# Patient Record
Sex: Male | Born: 1960 | Race: White | Hispanic: No | Marital: Married | Smoking: Former smoker
Health system: Southern US, Community
[De-identification: ages and names within clinical notes are randomized; demographics above are authoritative.]

## PROBLEM LIST (undated history)

## (undated) DIAGNOSIS — R918 Other nonspecific abnormal finding of lung field: Secondary | ICD-10-CM

## (undated) DIAGNOSIS — E291 Testicular hypofunction: Secondary | ICD-10-CM

## (undated) DIAGNOSIS — Z7901 Long term (current) use of anticoagulants: Secondary | ICD-10-CM

## (undated) DIAGNOSIS — R251 Tremor, unspecified: Secondary | ICD-10-CM

## (undated) DIAGNOSIS — E538 Deficiency of other specified B group vitamins: Secondary | ICD-10-CM

## (undated) DIAGNOSIS — E559 Vitamin D deficiency, unspecified: Secondary | ICD-10-CM

## (undated) DIAGNOSIS — Z87442 Personal history of urinary calculi: Secondary | ICD-10-CM

## (undated) DIAGNOSIS — I480 Paroxysmal atrial fibrillation: Secondary | ICD-10-CM

## (undated) DIAGNOSIS — E119 Type 2 diabetes mellitus without complications: Secondary | ICD-10-CM

## (undated) DIAGNOSIS — S069XAA Unspecified intracranial injury with loss of consciousness status unknown, initial encounter: Secondary | ICD-10-CM

## (undated) DIAGNOSIS — E7800 Pure hypercholesterolemia, unspecified: Secondary | ICD-10-CM

## (undated) DIAGNOSIS — R7303 Prediabetes: Secondary | ICD-10-CM

## (undated) DIAGNOSIS — E042 Nontoxic multinodular goiter: Secondary | ICD-10-CM

## (undated) DIAGNOSIS — G5622 Lesion of ulnar nerve, left upper limb: Secondary | ICD-10-CM

## (undated) DIAGNOSIS — I272 Pulmonary hypertension, unspecified: Secondary | ICD-10-CM

## (undated) DIAGNOSIS — M5135 Other intervertebral disc degeneration, thoracolumbar region: Secondary | ICD-10-CM

## (undated) DIAGNOSIS — I420 Dilated cardiomyopathy: Secondary | ICD-10-CM

## (undated) DIAGNOSIS — M8589 Other specified disorders of bone density and structure, multiple sites: Secondary | ICD-10-CM

## (undated) DIAGNOSIS — M170 Bilateral primary osteoarthritis of knee: Secondary | ICD-10-CM

## (undated) DIAGNOSIS — I5020 Unspecified systolic (congestive) heart failure: Secondary | ICD-10-CM

## (undated) DIAGNOSIS — I10 Essential (primary) hypertension: Secondary | ICD-10-CM

## (undated) DIAGNOSIS — F32A Depression, unspecified: Secondary | ICD-10-CM

## (undated) DIAGNOSIS — G43909 Migraine, unspecified, not intractable, without status migrainosus: Secondary | ICD-10-CM

## (undated) DIAGNOSIS — I4891 Unspecified atrial fibrillation: Secondary | ICD-10-CM

## (undated) DIAGNOSIS — I429 Cardiomyopathy, unspecified: Secondary | ICD-10-CM

## (undated) DIAGNOSIS — M1990 Unspecified osteoarthritis, unspecified site: Secondary | ICD-10-CM

## (undated) DIAGNOSIS — I4729 Other ventricular tachycardia: Secondary | ICD-10-CM

## (undated) DIAGNOSIS — G4733 Obstructive sleep apnea (adult) (pediatric): Secondary | ICD-10-CM

## (undated) DIAGNOSIS — N183 Chronic kidney disease, stage 3 unspecified: Secondary | ICD-10-CM

## (undated) DIAGNOSIS — R972 Elevated prostate specific antigen [PSA]: Secondary | ICD-10-CM

## (undated) DIAGNOSIS — I499 Cardiac arrhythmia, unspecified: Secondary | ICD-10-CM

## (undated) DIAGNOSIS — E1140 Type 2 diabetes mellitus with diabetic neuropathy, unspecified: Secondary | ICD-10-CM

## (undated) HISTORY — PX: CARDIAC CATHETERIZATION: SHX172

## (undated) HISTORY — PX: JOINT REPLACEMENT: SHX530

## (undated) HISTORY — PX: NASAL SINUS SURGERY: SHX719

## (undated) HISTORY — PX: EP IMPLANTABLE DEVICE: SHX172B

## (undated) HISTORY — PX: KNEE ARTHROSCOPY: SUR90

---

## 2001-11-17 HISTORY — PX: NASAL SINUS SURGERY: SHX719

## 2004-02-29 HISTORY — PX: KNEE ARTHROSCOPY W/ MENISCAL REPAIR: SHX1877

## 2005-08-13 ENCOUNTER — Ambulatory Visit: Payer: Self-pay | Admitting: Otolaryngology

## 2009-01-28 ENCOUNTER — Emergency Department: Payer: Self-pay

## 2011-02-14 ENCOUNTER — Ambulatory Visit: Payer: Self-pay | Admitting: Unknown Physician Specialty

## 2011-03-21 ENCOUNTER — Ambulatory Visit: Payer: Self-pay | Admitting: Unknown Physician Specialty

## 2011-04-01 HISTORY — PX: KNEE ARTHROSCOPY W/ MENISCAL REPAIR: SHX1877

## 2012-04-28 ENCOUNTER — Ambulatory Visit: Payer: Self-pay | Admitting: Cardiology

## 2012-04-28 HISTORY — PX: LEFT HEART CATH AND CORONARY ANGIOGRAPHY: CATH118249

## 2012-07-05 ENCOUNTER — Ambulatory Visit: Payer: Self-pay | Admitting: Cardiology

## 2013-01-11 ENCOUNTER — Encounter: Payer: Self-pay | Admitting: Cardiology

## 2013-01-15 ENCOUNTER — Encounter: Payer: Self-pay | Admitting: Cardiology

## 2013-02-15 ENCOUNTER — Encounter: Payer: Self-pay | Admitting: Cardiology

## 2013-03-17 ENCOUNTER — Encounter: Payer: Self-pay | Admitting: Cardiology

## 2013-03-21 DIAGNOSIS — Z95810 Presence of automatic (implantable) cardiac defibrillator: Secondary | ICD-10-CM

## 2013-03-21 HISTORY — DX: Presence of automatic (implantable) cardiac defibrillator: Z95.810

## 2013-03-31 HISTORY — PX: ICD IMPLANT: EP1208

## 2013-04-17 ENCOUNTER — Encounter: Payer: Self-pay | Admitting: Cardiology

## 2015-02-12 ENCOUNTER — Ambulatory Visit
Admit: 2015-02-12 | Disposition: A | Discharge status: Home / Self Care | Payer: Self-pay | Admitting: Unknown Physician Specialty

## 2015-07-30 ENCOUNTER — Encounter: Payer: Self-pay | Admitting: Emergency Medicine

## 2015-07-30 ENCOUNTER — Emergency Department: Payer: BLUE CROSS/BLUE SHIELD

## 2015-07-30 ENCOUNTER — Emergency Department
Admission: EM | Admit: 2015-07-30 | Discharge: 2015-07-30 | Disposition: A | Discharge status: Home / Self Care | Payer: BLUE CROSS/BLUE SHIELD | Attending: Emergency Medicine | Admitting: Emergency Medicine

## 2015-07-30 DIAGNOSIS — Z7901 Long term (current) use of anticoagulants: Secondary | ICD-10-CM | POA: Diagnosis not present

## 2015-07-30 DIAGNOSIS — R1032 Left lower quadrant pain: Secondary | ICD-10-CM | POA: Insufficient documentation

## 2015-07-30 DIAGNOSIS — I499 Cardiac arrhythmia, unspecified: Secondary | ICD-10-CM | POA: Diagnosis not present

## 2015-07-30 DIAGNOSIS — Z79899 Other long term (current) drug therapy: Secondary | ICD-10-CM | POA: Diagnosis not present

## 2015-07-30 DIAGNOSIS — Z87891 Personal history of nicotine dependence: Secondary | ICD-10-CM | POA: Insufficient documentation

## 2015-07-30 HISTORY — DX: Unspecified atrial fibrillation: I48.91

## 2015-07-30 LAB — URINALYSIS COMPLETE WITH MICROSCOPIC (ARMC ONLY)
BACTERIA UA: NONE SEEN
Bilirubin Urine: NEGATIVE
GLUCOSE, UA: NEGATIVE mg/dL
HGB URINE DIPSTICK: NEGATIVE
Ketones, ur: NEGATIVE mg/dL
LEUKOCYTES UA: NEGATIVE
NITRITE: NEGATIVE
PH: 7 (ref 5.0–8.0)
Protein, ur: NEGATIVE mg/dL
SPECIFIC GRAVITY, URINE: 1.01 (ref 1.005–1.030)
SQUAMOUS EPITHELIAL / LPF: NONE SEEN

## 2015-07-30 LAB — COMPREHENSIVE METABOLIC PANEL
ALBUMIN: 3.8 g/dL (ref 3.5–5.0)
ALK PHOS: 59 U/L (ref 38–126)
ALT: 17 U/L (ref 17–63)
AST: 25 U/L (ref 15–41)
Anion gap: 9 (ref 5–15)
BILIRUBIN TOTAL: 0.7 mg/dL (ref 0.3–1.2)
BUN: 18 mg/dL (ref 6–20)
CALCIUM: 8.5 mg/dL — AB (ref 8.9–10.3)
CO2: 26 mmol/L (ref 22–32)
Chloride: 105 mmol/L (ref 101–111)
Creatinine, Ser: 1.09 mg/dL (ref 0.61–1.24)
GFR calc non Af Amer: >60 mL/min (ref >60)
GLUCOSE: 131 mg/dL — AB (ref 65–99)
Potassium: 3.5 mmol/L (ref 3.5–5.1)
Sodium: 140 mmol/L (ref 135–145)
TOTAL PROTEIN: 7 g/dL (ref 6.5–8.1)

## 2015-07-30 LAB — CBC WITH DIFFERENTIAL/PLATELET
BASOS ABS: 0 10*3/uL (ref 0–0.1)
BASOS PCT: 1 %
Eosinophils Absolute: 0.1 10*3/uL (ref 0–0.7)
Eosinophils Relative: 2 %
HEMATOCRIT: 47.8 % (ref 40.0–52.0)
HEMOGLOBIN: 15.8 g/dL (ref 13.0–18.0)
LYMPHS PCT: 21 %
Lymphs Abs: 1.3 10*3/uL (ref 1.0–3.6)
MCH: 29.2 pg (ref 26.0–34.0)
MCHC: 33 g/dL (ref 32.0–36.0)
MCV: 88.3 fL (ref 80.0–100.0)
MONO ABS: 0.5 10*3/uL (ref 0.2–1.0)
Monocytes Relative: 8 %
NEUTROS ABS: 4.5 10*3/uL (ref 1.4–6.5)
NEUTROS PCT: 68 %
Platelets: 194 10*3/uL (ref 150–440)
RBC: 5.42 MIL/uL (ref 4.40–5.90)
RDW: 13.7 % (ref 11.5–14.5)
WBC: 6.5 10*3/uL (ref 3.8–10.6)

## 2015-07-30 LAB — LIPASE, BLOOD: Lipase: 22 U/L (ref 22–51)

## 2015-07-30 MED ORDER — KETOROLAC TROMETHAMINE 30 MG/ML IJ SOLN
30.0000 mg | Freq: Once | INTRAMUSCULAR | Status: AC
  Administered 2015-07-30: 30 mg via INTRAVENOUS
  Filled 2015-07-30: qty 1

## 2015-07-30 MED ORDER — NAPROXEN 500 MG PO TABS: 500.0000 mg | ORAL_TABLET | Freq: Two times a day (BID) | ORAL | Status: DC

## 2015-07-30 MED ORDER — GI COCKTAIL ~~LOC~~
30.0000 mL | ORAL | Status: AC
  Administered 2015-07-30: 30 mL via ORAL
  Filled 2015-07-30: qty 30

## 2015-07-30 MED ORDER — FAMOTIDINE 20 MG PO TABS
40.0000 mg | ORAL_TABLET | Freq: Once | ORAL | Status: AC
  Administered 2015-07-30: 40 mg via ORAL
  Filled 2015-07-30: qty 2

## 2015-07-30 MED ORDER — ONDANSETRON 8 MG PO TBDP: 8.0000 mg | ORAL_TABLET | Freq: Three times a day (TID) | ORAL | Status: DC | PRN

## 2015-07-30 NOTE — Discharge Instructions (Signed)

## 2015-07-30 NOTE — ED Provider Notes (Signed)
[facility] Emergency Department Provider Note  ____________________________________________  Time seen: 7:50 AM on arrival by EMS  I have reviewed the triage vital signs and the nursing notes.   HISTORY  Chief Complaint Abdominal Pain    HPI Tyler Montgomery is a 54 y.o. male complaining of left flank pain that started this morning and has now migrated to the left lower quadrant. Denies any nausea vomiting or diarrhea. No trauma. It's worse when he is upright and walking and better when he is lying down. Worse with movement. No chest pain shortness of breath fevers chills. No dysuria frequency urgency hematuria. Denies history of kidney stones.  Does have a history of chronic paroxysmal atrial fibrillation for which she follows up with Dr. Lady Gary. He also has an EF of 20% and has an ICD.   Past Medical History  Diagnosis Date  . A-fib      There are no active problems to display for this patient.    Past Surgical History  Procedure Laterality Date  . Joint replacement    . Ep implantable device    . Nasal sinus surgery       Current Outpatient Rx  Name  Route  Sig  Dispense  Refill  . carvedilol (COREG) 3.125 MG tablet   Oral   Take 1 tablet by mouth 2 (two) times daily.      0   . dofetilide (TIKOSYN) 500 MCG capsule   Oral   Take 1 capsule by mouth 2 (two) times daily.      2   . furosemide (LASIX) 40 MG tablet   Oral   Take 1-2 tablets by mouth 3 (three) times daily. Take 2 tablets in the morning and 1 tablet in the evening.      0   . lisinopril (PRINIVIL,ZESTRIL) 5 MG tablet   Oral   Take 1 tablet by mouth daily.         . metoprolol succinate (TOPROL-XL) 100 MG 24 hr tablet   Oral   Take 1 tablet by mouth daily.      3   . omeprazole (PRILOSEC) 40 MG capsule   Oral   Take 1 capsule by mouth daily.      4   . potassium chloride SA (K-DUR,KLOR-CON) 20 MEQ tablet   Oral   Take 1 tablet by mouth daily.      3   . testosterone (ANDROGEL) 50 MG/5GM (1%) GEL   Transdermal   Place 5 g onto the skin daily.       5   . XARELTO 20 MG TABS tablet   Oral   Take 1 tablet by mouth every evening.      6     Dispense as written.   . naproxen (NAPROSYN) 500 MG tablet   Oral   Take 1 tablet (500 mg total) by mouth 2 (two) times daily with a meal.   20 tablet   0   . ondansetron (ZOFRAN ODT) 8 MG disintegrating tablet   Oral   Take 1 tablet (8 mg total) by mouth every 8 (eight) hours as needed for nausea or vomiting.   20 tablet   0      Allergies Review of patient's allergies indicates no known allergies.   No family history on file.  Social History Social History  Substance Use Topics  . Smoking status: Former Smoker    Types: Cigarettes  . Smokeless tobacco: None  . Alcohol  Use: No    Review of Systems  Constitutional:   No fever or chills. No weight changes Eyes:   No blurry vision or double vision.  ENT:   No sore throat. Cardiovascular:   No chest pain. Respiratory:   No dyspnea or cough. Gastrointestinal:   Positive abdominal pain as above. No vomiting or diarrhea.  No BRBPR or melena. Genitourinary:   Negative for dysuria, urinary retention, bloody urine, or difficulty urinating. Musculoskeletal:   Negative for back pain. No joint swelling or pain. Skin:   Negative for rash. Neurological:   Negative for headaches, focal weakness or numbness. Psychiatric:  No anxiety or depression.   Endocrine:  No hot/cold intolerance, changes in energy, or sleep difficulty.  10-point ROS otherwise negative.  ____________________________________________   PHYSICAL EXAM:  VITAL SIGNS: ED Triage Vitals  Enc Vitals Group     BP 07/30/15 0758 131/93 mmHg     Pulse Rate 07/30/15 0758 112     Resp 07/30/15 0758 17     Temp 07/30/15 0758 98.1 F (36.7 C)     Temp Source 07/30/15 0758 Oral     SpO2 07/30/15 0758 97 %     Weight 07/30/15 0758 310 lb (140.615 kg)     Height  07/30/15 0758  (1.905 m)     Head Cir --      Peak Flow --      Pain Score 07/30/15 0800 1     Pain Loc --      Pain Edu? --      Excl. in GC? --      Constitutional:   Alert and oriented. Moderate distress due to pain  Eyes:   No scleral icterus. No conjunctival pallor. PERRL. EOMI ENT   Head:   Normocephalic and atraumatic.   Nose:   No congestion/rhinnorhea. No septal hematoma   Mouth/Throat:   MMM, no pharyngeal erythema. No peritonsillar mass. No uvula shift.   Neck:   No stridor. No SubQ emphysema. No meningismus. Hematological/Lymphatic/Immunilogical:   No cervical lymphadenopathy. Cardiovascular:   Irregularly irregular rhythm with a rate of about 100. Normal and symmetric distal pulses are present in all extremities. No murmurs, rubs, or gallops. Respiratory:   Normal respiratory effort without tachypnea nor retractions. Breath sounds are clear and equal bilaterally. No wheezes/rales/rhonchi. Gastrointestinal:   Soft and nontender. No distention. There is no CVA tenderness.  No rebound, rigidity, or guarding. Genitourinary:   deferred Musculoskeletal:   Nontender with normal range of motion in all extremities. No joint effusions.  No lower extremity tenderness.  No edema. Neurologic:   Normal speech and language.  CN 2-10 normal. Motor grossly intact. No pronator drift.  Normal gait. No gross focal neurologic deficits are appreciated.  Skin:    Skin is warm, dry and intact. No rash noted.  No petechiae, purpura, or bullae. Psychiatric:   Mood and affect are normal. Speech and behavior are normal. Patient exhibits appropriate insight and judgment.  ____________________________________________    LABS (pertinent positives/negatives) (all labs ordered are listed, but only abnormal results are displayed) Labs Reviewed  COMPREHENSIVE METABOLIC PANEL - Abnormal; Notable for the following:    Glucose, Bld 131 (*)    Calcium 8.5 (*)    All other components  within normal limits  URINALYSIS COMPLETEWITH MICROSCOPIC ([facility] ONLY) - Abnormal; Notable for the following:    Color, Urine YELLOW (*)    APPearance CLEAR (*)    All other components within normal limits  LIPASE, BLOOD  CBC WITH DIFFERENTIAL/PLATELET   ____________________________________________   EKG  Interpreted by me Atrial fibrillation, rate controlled with a heart rate of 111. Normal axis intervals QRS and ST segments and T waves.  ____________________________________________    RADIOLOGY  CT abdomen and pelvis without contrast unremarkable  ____________________________________________   PROCEDURES   ____________________________________________   INITIAL IMPRESSION / ASSESSMENT AND PLAN / ED COURSE  Pertinent labs & imaging results that were available during my care of the patient were reviewed by me and considered in my medical decision making (see chart for details).  Patient presents with acute onset of left flank pain and left lower quadrant pain that is out of proportion to exam. Exam reveals no acute or focal findings. With his history of cardiomyopathy and atrial fibrillation, there is the concern for mesenteric ischemia. We'll await lab work, and if unrevealing for any other explanation such as urinary tract infection, we may need to proceed with a CT angiogram of the abdomen.  ----------------------------------------- 11:57 AM on 07/30/2015 ----------------------------------------- Patient reassessed. He is ambulatory and pain-free. He is nontender. He indicates he only has a small amount of discomfort in the back around the SI joint, but again nontender and ambulatory and weightbearing. Workup is completely negative. Given the resolution of the pain, this does not appear to be consistent with mesenteric ischemia. He is also on Xarelto. He is well-appearing nontoxic no acute distress at this time. Vital signs are normal. We will discharge him home. He  spontaneously converted back to normal sinus rhythm shortly after arrival in the ER.    ____________________________________________   FINAL CLINICAL IMPRESSION(S) / ED DIAGNOSES  Final diagnoses:  Left lower quadrant pain      Sharman Cheek, MD 07/30/15 1157

## 2015-07-30 NOTE — ED Notes (Signed)
Pt here via ems with c/o left flank pain initially, however, now at LLQ. Denies cp, sob.

## 2016-01-21 ENCOUNTER — Encounter: Payer: Self-pay | Admitting: Emergency Medicine

## 2016-01-21 ENCOUNTER — Emergency Department
Admission: EM | Admit: 2016-01-21 | Discharge: 2016-01-21 | Disposition: A | Discharge status: Home / Self Care | Payer: BLUE CROSS/BLUE SHIELD | Attending: Emergency Medicine | Admitting: Emergency Medicine

## 2016-01-21 DIAGNOSIS — R230 Cyanosis: Secondary | ICD-10-CM | POA: Diagnosis not present

## 2016-01-21 DIAGNOSIS — R531 Weakness: Secondary | ICD-10-CM | POA: Insufficient documentation

## 2016-01-21 DIAGNOSIS — A0811 Acute gastroenteropathy due to Norwalk agent: Secondary | ICD-10-CM

## 2016-01-21 DIAGNOSIS — B9789 Other viral agents as the cause of diseases classified elsewhere: Secondary | ICD-10-CM | POA: Diagnosis not present

## 2016-01-21 DIAGNOSIS — R079 Chest pain, unspecified: Secondary | ICD-10-CM | POA: Diagnosis present

## 2016-01-21 DIAGNOSIS — Z87891 Personal history of nicotine dependence: Secondary | ICD-10-CM | POA: Insufficient documentation

## 2016-01-21 DIAGNOSIS — R197 Diarrhea, unspecified: Secondary | ICD-10-CM | POA: Diagnosis not present

## 2016-01-21 DIAGNOSIS — R112 Nausea with vomiting, unspecified: Secondary | ICD-10-CM | POA: Insufficient documentation

## 2016-01-21 DIAGNOSIS — R55 Syncope and collapse: Secondary | ICD-10-CM | POA: Insufficient documentation

## 2016-01-21 HISTORY — DX: Cardiomyopathy, unspecified: I42.9

## 2016-01-21 LAB — BASIC METABOLIC PANEL
ANION GAP: 9 (ref 5–15)
BUN: 27 mg/dL — ABNORMAL HIGH (ref 6–20)
CALCIUM: 9 mg/dL (ref 8.9–10.3)
CO2: 26 mmol/L (ref 22–32)
Chloride: 103 mmol/L (ref 101–111)
Creatinine, Ser: 1.44 mg/dL — ABNORMAL HIGH (ref 0.61–1.24)
GFR, EST NON AFRICAN AMERICAN: 54 mL/min — AB (ref >60)
Glucose, Bld: 116 mg/dL — ABNORMAL HIGH (ref 65–99)
POTASSIUM: 4.4 mmol/L (ref 3.5–5.1)
SODIUM: 138 mmol/L (ref 135–145)

## 2016-01-21 LAB — CBC
HEMATOCRIT: 51.2 % (ref 40.0–52.0)
HEMOGLOBIN: 17.1 g/dL (ref 13.0–18.0)
MCH: 29.5 pg (ref 26.0–34.0)
MCHC: 33.5 g/dL (ref 32.0–36.0)
MCV: 88.2 fL (ref 80.0–100.0)
Platelets: 183 10*3/uL (ref 150–440)
RBC: 5.8 MIL/uL (ref 4.40–5.90)
RDW: 14.2 % (ref 11.5–14.5)
WBC: 10 10*3/uL (ref 3.8–10.6)

## 2016-01-21 LAB — TROPONIN I

## 2016-01-21 MED ORDER — ONDANSETRON HCL 4 MG/2ML IJ SOLN
4.0000 mg | Freq: Once | INTRAMUSCULAR | Status: AC
  Administered 2016-01-21: 4 mg via INTRAVENOUS
  Filled 2016-01-21: qty 2

## 2016-01-21 MED ORDER — ONDANSETRON HCL 4 MG PO TABS: 4.0000 mg | ORAL_TABLET | Freq: Every day | ORAL | Status: DC | PRN

## 2016-01-21 MED ORDER — SODIUM CHLORIDE 0.9 % IV SOLN
Freq: Once | INTRAVENOUS | Status: AC
Start: 2016-01-21 — End: 2016-01-21
  Administered 2016-01-21: 11:00:00 via INTRAVENOUS

## 2016-01-21 MED ORDER — SODIUM CHLORIDE 0.9 % IV SOLN
Freq: Once | INTRAVENOUS | Status: AC
  Administered 2016-01-21: 12:00:00 via INTRAVENOUS

## 2016-01-21 MED ORDER — LOPERAMIDE HCL 2 MG PO CAPS
4.0000 mg | ORAL_CAPSULE | Freq: Once | ORAL | Status: AC
  Administered 2016-01-21: 4 mg via ORAL
  Filled 2016-01-21: qty 2

## 2016-01-21 NOTE — ED Provider Notes (Signed)
[facility] Emergency Department Provider Note     Time seen: ----------------------------------------- 10:42 AM on 01/21/2016 -----------------------------------------    I have reviewed the triage vital signs and the nursing notes.   HISTORY  Chief Complaint No chief complaint on file.    HPI Tyler Montgomery is a 55 y.o. male who presents to ER with near syncope, nausea vomiting and diarrhea. Patient states he woke up this morning early with nausea vomiting and diarrhea and has felt weak and lightheaded since that period time. Nothing is made his symptoms better. He has been keeping anything down today.   Past Medical History  Diagnosis Date  . A-fib     There are no active problems to display for this patient.   Past Surgical History  Procedure Laterality Date  . Joint replacement    . Ep implantable device    . Nasal sinus surgery      Allergies Review of patient's allergies indicates no known allergies.  Social History Social History  Substance Use Topics  . Smoking status: Former Smoker    Types: Cigarettes  . Smokeless tobacco: Not on file  . Alcohol Use: No    Review of Systems Constitutional: Negative for fever. Eyes: Negative for visual changes. ENT: Negative for sore throat. Cardiovascular: Negative for chest pain. Respiratory: Negative for shortness of breath. Gastrointestinal: Negative for abdominal pain, positive for vomiting and diarrhea Genitourinary: Negative for dysuria. Musculoskeletal: Negative for back pain. Skin: Negative for rash. Neurological: Negative for headaches, positive for weakness  10-point ROS otherwise negative.  ____________________________________________   PHYSICAL EXAM:  VITAL SIGNS: ED Triage Vitals  Enc Vitals Group     BP --      Pulse --      Resp --      Temp --      Temp src --      SpO2 --      Weight --      Height --      Head Cir --      Peak Flow --      Pain  Score --      Pain Loc --      Pain Edu? --      Excl. in GC? --     Constitutional: Alert and oriented. Mild distress Eyes: Conjunctivae are normal. PERRL. Normal extraocular movements. ENT   Head: Normocephalic and atraumatic.   Nose: No congestion/rhinnorhea.   Mouth/Throat: Mucous membranes are dry. Perioral cyanosis   Neck: No stridor. Cardiovascular: Rapid rate, regular rhythm. Normal and symmetric distal pulses are present in all extremities. No murmurs, rubs, or gallops. Respiratory: Normal respiratory effort without tachypnea nor retractions. Breath sounds are clear and equal bilaterally. No wheezes/rales/rhonchi. Gastrointestinal: Soft and nontender. No distention. No abdominal bruits.  Musculoskeletal: Nontender with normal range of motion in all extremities. No joint effusions.  No lower extremity tenderness nor edema. Neurologic:  Normal speech and language. No gross focal neurologic deficits are appreciated. Patient is near syncopal with standing Skin:  Skin is warm, dry and intact. No rash noted. Psychiatric: Mood and affect are normal. Speech and behavior are normal. Patient exhibits appropriate insight and judgment. ____________________________________________  EKG: Interpreted by me. Sinus tachycardia with a rate of 105 bpm, normal PR interval, normal QRS, normal QT interval. Normal axis.  ____________________________________________  ED COURSE:  Pertinent labs & imaging results that were available during my care of the patient were reviewed by me and considered in my  medical decision making (see chart for details). Patient is likely dehydrated from Norovirus infection. I will check basic labs reevaluate. ____________________________________________    LABS (pertinent positives/negatives)  Labs Reviewed  BASIC METABOLIC PANEL - Abnormal; Notable for the following:    Glucose, Bld 116 (*)    BUN 27 (*)    Creatinine, Ser 1.44 (*)    GFR calc non Af  Amer 54 (*)    All other components within normal limits  CBC  TROPONIN I   ____________________________________________  FINAL ASSESSMENT AND PLAN  Norovirus, near syncope  Plan: Patient with labs as dictated above. Patient is in no acute distress, appeared dehydrated. I will advise holding his Lasix today. He has received fluids and antiemetics and is currently feeling better. He stable for outpatient follow-up with his doctor.   Emily Filbert, MD   Emily Filbert, MD 01/21/16 [phone]

## 2016-01-21 NOTE — Discharge Instructions (Signed)
Near-Syncope Near-syncope (commonly known as near fainting) is sudden weakness, dizziness, or feeling like you might pass out. During an episode of near-syncope, you may also develop pale skin, have tunnel vision, or feel sick to your stomach (nauseous). Near-syncope may occur when getting up after sitting or while standing for a long time. It is caused by a sudden decrease in blood flow to the brain. This decrease can result from various causes or triggers, most of which are not serious. However, because near-syncope can sometimes be a sign of something serious, a medical evaluation is required. The specific cause is often not determined. HOME CARE INSTRUCTIONS  Monitor your condition for any changes. The following actions may help to alleviate any discomfort you are experiencing:  Have someone stay with you until you feel stable.  Lie down right away and prop your feet up if you start feeling like you might faint. Breathe deeply and steadily. Wait until all the symptoms have passed. Most of these episodes last only a few minutes. You may feel tired for several hours.   Drink enough fluids to keep your urine clear or pale yellow.   If you are taking blood pressure or heart medicine, get up slowly when seated or lying down. Take several minutes to sit and then stand. This can reduce dizziness.  Follow up with your health care provider as directed. SEEK IMMEDIATE MEDICAL CARE IF:   You have a severe headache.   You have unusual pain in the chest, abdomen, or back.   You are bleeding from the mouth or rectum, or you have black or tarry stool.   You have an irregular or very fast heartbeat.   You have repeated fainting or have seizure-like jerking during an episode.   You faint when sitting or lying down.   You have confusion.   You have difficulty walking.   You have severe weakness.   You have vision problems.  MAKE SURE YOU:   Understand these instructions.  Will  watch your condition.  Will get help right away if you are not doing well or get worse.   This information is not intended to replace advice given to you by your health care provider. Make sure you discuss any questions you have with your health care provider.   Document Released: 11/03/2005 Document Revised: 11/08/2013 Document Reviewed: 04/08/2013 Elsevier Interactive Patient Education 2016 [employer].  Norovirus Infection A norovirus infection is caused by exposure to a virus in a group of similar viruses (noroviruses). This type of infection causes inflammation in your stomach and intestines (gastroenteritis). Norovirus is the most common cause of gastroenteritis. It also causes food poisoning. Anyone can get a norovirus infection. It spreads very easily (contagious). You can get it from contaminated food, water, surfaces, or other people. Norovirus is found in the stool or vomit of infected people. You can spread the infection as soon as you feel sick until 2 weeks after you recover.  Symptoms usually begin within 2 days after you become infected. Most norovirus symptoms affect the digestive system. CAUSES Norovirus infection is caused by contact with norovirus. You can catch norovirus if you:  Eat or drink something contaminated with norovirus.  Touch surfaces or objects contaminated with norovirus and then put your hand in your mouth.  Have direct contact with an infected person who has symptoms.  Share food, drink, or utensils with someone with who is sick with norovirus. SIGNS AND SYMPTOMS Symptoms of norovirus may include:  Nausea.  Vomiting.  Diarrhea.  Stomach cramps.  Fever.  Chills.  Headache.  Muscle aches.  Tiredness. DIAGNOSIS Your health care provider may suspect norovirus based on your symptoms and physical exam. Your health care provider may also test a sample of your stool or vomit for the virus.  TREATMENT There is no specific treatment for  norovirus. Most people get better without treatment in about 2 days. HOME CARE INSTRUCTIONS  Replace lost fluids by drinking plenty of water or rehydration fluids containing important minerals called electrolytes. This prevents dehydration. Drink enough fluid to keep your urine clear or pale yellow.  Do not prepare food for others while you are infected. Wait at least 3 days after recovering from the illness to do that. PREVENTION   Wash your hands often, especially after using the toilet or changing a diaper.  Wash fruits and vegetables thoroughly before preparing or serving them.  Throw out any food that a sick person may have touched.  Disinfect contaminated surfaces immediately after someone in the household has been sick. Use a bleach-based household cleaner.  Immediately remove and wash soiled clothes or sheets. SEEK MEDICAL CARE IF:  Your vomiting, diarrhea, and stomach pain is getting worse.  Your symptoms of norovirus do not go away after 2-3 days. SEEK IMMEDIATE MEDICAL CARE IF:  You develop symptoms of dehydration that do not improve with fluid replacement. This may include:  Excessive sleepiness.  Lack of tears.  Dry mouth.  Dizziness when standing.  Weak pulse.   This information is not intended to replace advice given to you by your health care provider. Make sure you discuss any questions you have with your health care provider.   Document Released: 01/24/2003 Document Revised: 11/24/2014 Document Reviewed: 04/13/2014 Elsevier Interactive Patient Education [employer]2016 Elsevier Inc.

## 2016-01-21 NOTE — ED Notes (Signed)
States chest pain since 5am, nausea, vomiting and diarrhea. Denies fever.

## 2016-07-09 HISTORY — PX: CARDIAC ELECTROPHYSIOLOGY STUDY AND ABLATION: SHX1294

## 2017-03-30 ENCOUNTER — Encounter: Payer: Self-pay | Admitting: Vascular Surgery

## 2017-03-30 ENCOUNTER — Inpatient Hospital Stay: Payer: Medicare Other

## 2017-03-30 ENCOUNTER — Inpatient Hospital Stay
Admission: EM | Admit: 2017-03-30 | Discharge: 2017-03-31 | DRG: 872 | Disposition: A | Discharge status: Home / Self Care | Payer: Medicare Other | Attending: Internal Medicine | Admitting: Internal Medicine

## 2017-03-30 ENCOUNTER — Emergency Department: Payer: Medicare Other

## 2017-03-30 DIAGNOSIS — I480 Paroxysmal atrial fibrillation: Secondary | ICD-10-CM | POA: Diagnosis present

## 2017-03-30 DIAGNOSIS — R197 Diarrhea, unspecified: Secondary | ICD-10-CM | POA: Diagnosis present

## 2017-03-30 DIAGNOSIS — E119 Type 2 diabetes mellitus without complications: Secondary | ICD-10-CM | POA: Diagnosis present

## 2017-03-30 DIAGNOSIS — Z79899 Other long term (current) drug therapy: Secondary | ICD-10-CM

## 2017-03-30 DIAGNOSIS — Z7901 Long term (current) use of anticoagulants: Secondary | ICD-10-CM | POA: Diagnosis not present

## 2017-03-30 DIAGNOSIS — I429 Cardiomyopathy, unspecified: Secondary | ICD-10-CM | POA: Diagnosis present

## 2017-03-30 DIAGNOSIS — I110 Hypertensive heart disease with heart failure: Secondary | ICD-10-CM | POA: Diagnosis present

## 2017-03-30 DIAGNOSIS — A419 Sepsis, unspecified organism: Secondary | ICD-10-CM | POA: Diagnosis present

## 2017-03-30 DIAGNOSIS — R000 Tachycardia, unspecified: Secondary | ICD-10-CM | POA: Diagnosis present

## 2017-03-30 DIAGNOSIS — R112 Nausea with vomiting, unspecified: Secondary | ICD-10-CM | POA: Diagnosis present

## 2017-03-30 DIAGNOSIS — I5022 Chronic systolic (congestive) heart failure: Secondary | ICD-10-CM | POA: Diagnosis present

## 2017-03-30 DIAGNOSIS — Z87891 Personal history of nicotine dependence: Secondary | ICD-10-CM

## 2017-03-30 DIAGNOSIS — R109 Unspecified abdominal pain: Secondary | ICD-10-CM | POA: Diagnosis not present

## 2017-03-30 HISTORY — DX: Type 2 diabetes mellitus without complications: E11.9

## 2017-03-30 LAB — CBC WITH DIFFERENTIAL/PLATELET
BASOS ABS: 0 10*3/uL (ref 0–0.1)
Basophils Relative: 0 %
EOS ABS: 0 10*3/uL (ref 0–0.7)
Eosinophils Relative: 1 %
HEMATOCRIT: 44.1 % (ref 40.0–52.0)
HEMOGLOBIN: 15.2 g/dL (ref 13.0–18.0)
Lymphocytes Relative: 3 %
Lymphs Abs: 0.2 10*3/uL — ABNORMAL LOW (ref 1.0–3.6)
MCH: 29.6 pg (ref 26.0–34.0)
MCHC: 34.4 g/dL (ref 32.0–36.0)
MCV: 86 fL (ref 80.0–100.0)
MONO ABS: 0.4 10*3/uL (ref 0.2–1.0)
MONOS PCT: 5 %
NEUTROS ABS: 6.6 10*3/uL — AB (ref 1.4–6.5)
Neutrophils Relative %: 91 %
Platelets: 187 10*3/uL (ref 150–440)
RBC: 5.13 MIL/uL (ref 4.40–5.90)
RDW: 13.6 % (ref 11.5–14.5)
WBC: 7.3 10*3/uL (ref 3.8–10.6)

## 2017-03-30 LAB — COMPREHENSIVE METABOLIC PANEL
ALBUMIN: 3.9 g/dL (ref 3.5–5.0)
ALT: 15 U/L — ABNORMAL LOW (ref 17–63)
ANION GAP: 10 (ref 5–15)
AST: 28 U/L (ref 15–41)
Alkaline Phosphatase: 60 U/L (ref 38–126)
BUN: 19 mg/dL (ref 6–20)
CALCIUM: 8.8 mg/dL — AB (ref 8.9–10.3)
CO2: 24 mmol/L (ref 22–32)
CREATININE: 1.14 mg/dL (ref 0.61–1.24)
Chloride: 105 mmol/L (ref 101–111)
GFR calc Af Amer: >60 mL/min (ref >60)
GFR calc non Af Amer: >60 mL/min (ref >60)
GLUCOSE: 124 mg/dL — AB (ref 65–99)
POTASSIUM: 3.3 mmol/L — AB (ref 3.5–5.1)
SODIUM: 139 mmol/L (ref 135–145)
Total Bilirubin: 1.3 mg/dL — ABNORMAL HIGH (ref 0.3–1.2)
Total Protein: 7.7 g/dL (ref 6.5–8.1)

## 2017-03-30 LAB — URINALYSIS, ROUTINE W REFLEX MICROSCOPIC
Bilirubin Urine: NEGATIVE
Glucose, UA: NEGATIVE mg/dL
Hgb urine dipstick: NEGATIVE
KETONES UR: NEGATIVE mg/dL
Leukocytes, UA: NEGATIVE
NITRITE: NEGATIVE
Protein, ur: NEGATIVE mg/dL
SPECIFIC GRAVITY, URINE: 1.025 (ref 1.005–1.030)
pH: 5 (ref 5.0–8.0)

## 2017-03-30 LAB — LACTIC ACID, PLASMA
Lactic Acid, Venous: 3 mmol/L (ref 0.5–1.9)
Lactic Acid, Venous: 2 mmol/L (ref 0.5–1.9)

## 2017-03-30 LAB — C DIFFICILE QUICK SCREEN W PCR REFLEX
C DIFFICILE (CDIFF) INTERP: NOT DETECTED
C DIFFICILE (CDIFF) TOXIN: NEGATIVE
C Diff antigen: NEGATIVE

## 2017-03-30 LAB — LIPASE, BLOOD: Lipase: 18 U/L (ref 11–51)

## 2017-03-30 LAB — GLUCOSE, CAPILLARY: Glucose-Capillary: 111 mg/dL — ABNORMAL HIGH (ref 65–99)

## 2017-03-30 LAB — TROPONIN I: Troponin I: <0.03 ng/mL (ref <0.03)

## 2017-03-30 MED ORDER — PIPERACILLIN-TAZOBACTAM 3.375 G IVPB 30 MIN
3.3750 g | Freq: Once | INTRAVENOUS | Status: DC
  Administered 2017-03-30: 3.375 g via INTRAVENOUS
  Filled 2017-03-30: qty 50

## 2017-03-30 MED ORDER — RIVAROXABAN 20 MG PO TABS
20.0000 mg | ORAL_TABLET | Freq: Every evening | ORAL | Status: DC
  Administered 2017-03-30: 20:00:00 20 mg via ORAL
  Filled 2017-03-30: qty 1

## 2017-03-30 MED ORDER — ONDANSETRON HCL 4 MG/2ML IJ SOLN
4.0000 mg | Freq: Once | INTRAMUSCULAR | Status: AC
  Administered 2017-03-30: 4 mg via INTRAVENOUS
  Filled 2017-03-30: qty 2

## 2017-03-30 MED ORDER — INSULIN ASPART 100 UNIT/ML ~~LOC~~ SOLN: 0.0000 [IU] | Freq: Three times a day (TID) | SUBCUTANEOUS | Status: DC

## 2017-03-30 MED ORDER — POTASSIUM CHLORIDE 20 MEQ PO PACK
80.0000 meq | PACK | Freq: Every day | ORAL | Status: DC
  Administered 2017-03-30 – 2017-03-31 (×2): 80 meq via ORAL
  Filled 2017-03-30 (×2): qty 4

## 2017-03-30 MED ORDER — METRONIDAZOLE IN NACL 5-0.79 MG/ML-% IV SOLN
500.0000 mg | Freq: Three times a day (TID) | INTRAVENOUS | Status: DC
  Administered 2017-03-30 – 2017-03-31 (×3): 500 mg via INTRAVENOUS
  Filled 2017-03-30 (×5): qty 100

## 2017-03-30 MED ORDER — METRONIDAZOLE IN NACL 5-0.79 MG/ML-% IV SOLN
INTRAVENOUS | Status: AC
  Filled 2017-03-30: qty 100

## 2017-03-30 MED ORDER — ENOXAPARIN SODIUM 40 MG/0.4ML ~~LOC~~ SOLN: 40.0000 mg | SUBCUTANEOUS | Status: DC

## 2017-03-30 MED ORDER — METOPROLOL SUCCINATE ER 25 MG PO TB24
25.0000 mg | ORAL_TABLET | Freq: Every day | ORAL | Status: DC
  Administered 2017-03-30 – 2017-03-31 (×2): 25 mg via ORAL
  Filled 2017-03-30 (×2): qty 1

## 2017-03-30 MED ORDER — ALBUTEROL SULFATE (2.5 MG/3ML) 0.083% IN NEBU: 2.5000 mg | INHALATION_SOLUTION | RESPIRATORY_TRACT | Status: DC | PRN

## 2017-03-30 MED ORDER — SODIUM CHLORIDE 0.9 % IV BOLUS (SEPSIS)
1000.0000 mL | Freq: Once | INTRAVENOUS | Status: AC
  Administered 2017-03-30: 1000 mL via INTRAVENOUS

## 2017-03-30 MED ORDER — SODIUM CHLORIDE 0.9% FLUSH
3.0000 mL | Freq: Two times a day (BID) | INTRAVENOUS | Status: DC
  Administered 2017-03-30 – 2017-03-31 (×2): 3 mL via INTRAVENOUS

## 2017-03-30 MED ORDER — ONDANSETRON HCL 4 MG PO TABS: 4.0000 mg | ORAL_TABLET | Freq: Four times a day (QID) | ORAL | Status: DC | PRN

## 2017-03-30 MED ORDER — INSULIN ASPART 100 UNIT/ML ~~LOC~~ SOLN: 0.0000 [IU] | Freq: Every day | SUBCUTANEOUS | Status: DC

## 2017-03-30 MED ORDER — CIPROFLOXACIN IN D5W 400 MG/200ML IV SOLN
400.0000 mg | Freq: Two times a day (BID) | INTRAVENOUS | Status: DC
  Administered 2017-03-30 – 2017-03-31 (×2): 400 mg via INTRAVENOUS
  Filled 2017-03-30 (×4): qty 200

## 2017-03-30 MED ORDER — ACETAMINOPHEN 325 MG PO TABS
650.0000 mg | ORAL_TABLET | Freq: Four times a day (QID) | ORAL | Status: DC | PRN
  Administered 2017-03-30 – 2017-03-31 (×2): 650 mg via ORAL
  Filled 2017-03-30 (×2): qty 2

## 2017-03-30 MED ORDER — VANCOMYCIN HCL IN DEXTROSE 1-5 GM/200ML-% IV SOLN
1000.0000 mg | Freq: Once | INTRAVENOUS | Status: DC
  Filled 2017-03-30: qty 200

## 2017-03-30 MED ORDER — ONDANSETRON HCL 4 MG/2ML IJ SOLN: 4.0000 mg | Freq: Four times a day (QID) | INTRAMUSCULAR | Status: DC | PRN

## 2017-03-30 MED ORDER — ACETAMINOPHEN 500 MG PO TABS
1000.0000 mg | ORAL_TABLET | Freq: Once | ORAL | Status: AC
  Administered 2017-03-30: 1000 mg via ORAL
  Filled 2017-03-30: qty 2

## 2017-03-30 MED ORDER — ACETAMINOPHEN 650 MG RE SUPP: 650.0000 mg | Freq: Four times a day (QID) | RECTAL | Status: DC | PRN

## 2017-03-30 MED ORDER — POTASSIUM CHLORIDE IN NACL 20-0.9 MEQ/L-% IV SOLN
INTRAVENOUS | Status: DC
  Administered 2017-03-30: 22:00:00 via INTRAVENOUS
  Filled 2017-03-30 (×3): qty 1000

## 2017-03-30 NOTE — ED Notes (Signed)
Per charge on 1 C moving pt from room 129 to 130

## 2017-03-30 NOTE — Progress Notes (Signed)
ANTIBIOTIC CONSULT NOTE - INITIAL  Pharmacy Consult for Cipro Indication: intra-abdominal   No Known Allergies  Patient Measurements: Height: 6\' 4"  (193 cm) Weight: 235 lb (106.6 kg) IBW/kg (Calculated) : 86.8 Adjusted Body Weight:   Vital Signs: Temp: 99.2 F (37.3 C) (05/14 1600) Temp Source: Oral (05/14 1600) BP: 136/89 (05/14 1730) Pulse Rate: 113 (05/14 1730) Intake/Output from previous day: No intake/output data recorded. Intake/Output from this shift: Total I/O In: 2050 [IV Piggyback:2050] Out: -   Labs:  Recent Labs  03/30/17 1144  WBC 7.3  HGB 15.2  PLT 187  CREATININE 1.14   Estimated Creatinine Clearance: 99.2 mL/min (by C-G formula based on SCr of 1.14 mg/dL). No results for input(s): VANCOTROUGH, VANCOPEAK, VANCORANDOM, GENTTROUGH, GENTPEAK, GENTRANDOM, TOBRATROUGH, TOBRAPEAK, TOBRARND, AMIKACINPEAK, AMIKACINTROU, AMIKACIN in the last 72 hours.   Microbiology: No results found for this or any previous visit (from the past 720 hour(s)).  Medical History: Past Medical History:  Diagnosis Date  . A-fib (HCC)   . Cardiomyopathy (HCC)   . Diabetes (HCC)     Medications:   (Not in a hospital admission) Scheduled:  . enoxaparin (LOVENOX) injection  40 mg Subcutaneous Q24H  . insulin aspart  0-5 Units Subcutaneous QHS  . [START ON 03/31/2017] insulin aspart  0-9 Units Subcutaneous TID WC  . sodium chloride flush  3 mL Intravenous Q12H   Assessment: Pharmacy consulted to dose and monitor Cipro therapy in this 56 year old male.   Goal of Therapy:    Plan:  Will start Cipro 400 mg IV q12 hours.   Wynne Jury D 03/30/2017,6:20 PM

## 2017-03-30 NOTE — H&P (Signed)
[employer] - [place] [facility]   PATIENT NAME: Tyler Montgomery Wempe    MR#:  [id number]  DATE OF BIRTH:  04/28/1961  DATE OF ADMISSION:  03/30/2017  PRIMARY CARE PHYSICIAN: Mickey Farberhies, David, MD   REQUESTING/REFERRING PHYSICIAN: Dr. Raynelle CharySchavitz  CHIEF COMPLAINT:   Chief Complaint  Patient presents with  . Abdominal Pain    HISTORY OF PRESENT ILLNESS:  Tyler Montgomery Duell  is a 56 y.o. male with a known history of Paroxysmal atrial fibrillation status post ablation, hypertension, diabetes, chronic systolic CHF with ejection fraction 45% presents to the emergency room complaining of 2 days of nausea, vomiting, diarrhea and abdominal pain. He was at a family get together barbecue. Multiple family members have similar symptoms although no one has been admitted to the hospital. He has had 8-10 watery stools for 2 days now. He has been trying to drink plenty of fluids but feels lightheaded and dizzy. Chills. Here in the emergency room he has temperature 102 with lactic acid of 3. Given 2 L normal saline. Continue stool feel bad with nausea. Tachycardic in the 120s sinus tachycardia. Patient is being admitted with sepsis.  PAST MEDICAL HISTORY:   Past Medical History:  Diagnosis Date  . A-fib (HCC)   . Cardiomyopathy (HCC)   . Diabetes (HCC)     PAST SURGICAL HISTORY:   Past Surgical History:  Procedure Laterality Date  . EP IMPLANTABLE DEVICE    . JOINT REPLACEMENT    . NASAL SINUS SURGERY      SOCIAL HISTORY:   Social History  Substance Use Topics  . Smoking status: Former Smoker    Types: Cigarettes  . Smokeless tobacco: Never Used  . Alcohol use No    FAMILY HISTORY:   Family History  Problem Relation Age of Onset  . Diabetes Brother   . Hypertension Brother     DRUG ALLERGIES:  No Known Allergies  REVIEW OF SYSTEMS:   Review of Systems  Constitutional: Positive for malaise/fatigue. Negative for chills, fever and weight loss.  HENT: Negative for  hearing loss and nosebleeds.   Eyes: Negative for blurred vision, double vision and pain.  Respiratory: Negative for cough, hemoptysis, sputum production, shortness of breath and wheezing.   Cardiovascular: Negative for chest pain, palpitations, orthopnea and leg swelling.  Gastrointestinal: Positive for abdominal pain, diarrhea, nausea and vomiting. Negative for constipation.  Genitourinary: Negative for dysuria and hematuria.  Musculoskeletal: Positive for myalgias. Negative for back pain and falls.  Skin: Negative for rash.  Neurological: Positive for weakness. Negative for dizziness, tremors, sensory change, speech change, focal weakness, seizures and headaches.  Endo/Heme/Allergies: Does not bruise/bleed easily.  Psychiatric/Behavioral: Negative for depression and memory loss. The patient is not nervous/anxious.     MEDICATIONS AT HOME:   Prior to Admission medications   Medication Sig Start Date End Date Taking? Authorizing Provider  Cholecalciferol ([employer] D3) 2000 units CAPS Take 2,000 Units by mouth daily.   Yes [provider]  cyanocobalamin 1000 MCG tablet Take 1,000 mcg by mouth daily.   Yes [provider]  furosemide (LASIX) 40 MG tablet Take 80 mg by mouth daily. Take 2 tablets in the morning and 1 tablet in the evening. 07/02/15  Yes [provider]  losartan (COZAAR) 25 MG tablet Take 25 mg by mouth daily. 02/04/17 02/04/18 Yes [provider]  metoprolol succinate (TOPROL-XL) 25 MG 24 hr tablet Take 1 tablet by mouth daily. 07/02/15  Yes [provider]  omeprazole (PRILOSEC) 40  MG capsule Take 1 capsule by mouth daily. 07/17/15  Yes [provider]  potassium chloride (KLOR-CON) 20 MEQ packet Take 4 packets by mouth daily. 11/18/16  Yes [provider]  VIAGRA 50 MG tablet Take 50 mg by mouth daily as needed. 02/04/17  Yes [provider]  XARELTO 20 MG TABS tablet Take 1 tablet by mouth every evening. 06/12/15   Yes [provider]  naproxen (NAPROSYN) 500 MG tablet Take 1 tablet (500 mg total) by mouth 2 (two) times daily with a meal. Patient not taking: Reported on 03/30/2017 07/30/15   Sharman Cheek, MD  ondansetron (ZOFRAN ODT) 8 MG disintegrating tablet Take 1 tablet (8 mg total) by mouth every 8 (eight) hours as needed for nausea or vomiting. Patient not taking: Reported on 03/30/2017 07/30/15   Sharman Cheek, MD  ondansetron (ZOFRAN) 4 MG tablet Take 1 tablet (4 mg total) by mouth daily as needed for nausea or vomiting. Patient not taking: Reported on 03/30/2017 01/21/16   Emily Filbert, MD     VITAL SIGNS:  Blood pressure 122/82, pulse (!) 103, temperature 99.2 F (37.3 C), temperature source Oral, resp. rate 19, SpO2 99 %.  PHYSICAL EXAMINATION:  Physical Exam  GENERAL:  56 y.o.-year-old patient lying in the bed with no acute distress.  EYES: Pupils equal, round, reactive to light and accommodation. No scleral icterus. Extraocular muscles intact.  HEENT: Head atraumatic, normocephalic. Oropharynx and nasopharynx clear. No oropharyngeal erythema, moist oral mucosa  NECK:  Supple, no jugular venous distention. No thyroid enlargement, no tenderness.  LUNGS: Normal breath sounds bilaterally, no wheezing, rales, rhonchi. No use of accessory muscles of respiration.  CARDIOVASCULAR: S1, S2 normal. No murmurs, rubs, or gallops.  ABDOMEN: Soft. Mild tenderness in the lower abdomen. Bowel sounds increased. EXTREMITIES: No pedal edema, cyanosis, or clubbing. + 2 pedal & radial pulses b/l.   NEUROLOGIC: Cranial nerves II through XII are intact. No focal Motor or sensory deficits appreciated b/l PSYCHIATRIC: The patient is alert and oriented x 3. Good affect.  SKIN: No obvious rash, lesion, or ulcer.   LABORATORY PANEL:   CBC  Recent Labs Lab 03/30/17 1144  WBC 7.3  HGB 15.2  HCT 44.1  PLT 187    ------------------------------------------------------------------------------------------------------------------  Chemistries   Recent Labs Lab 03/30/17 1144  NA 139  K 3.3*  CL 105  CO2 24  GLUCOSE 124*  BUN 19  CREATININE 1.14  CALCIUM 8.8*  AST 28  ALT 15*  ALKPHOS 60  BILITOT 1.3*   ------------------------------------------------------------------------------------------------------------------  Cardiac Enzymes  Recent Labs Lab 03/30/17 1144  TROPONINI <0.03   ------------------------------------------------------------------------------------------------------------------  RADIOLOGY:  Dg Chest 1 View  Result Date: 03/30/2017 CLINICAL DATA:  Shortness of breath . EXAM: CHEST 1 VIEW COMPARISON:  01/28/2009 . FINDINGS: Cardiac pacer with lead tip over the right ventricle. Stable mild cardiomegaly. Mediastinum hilar structures normal . Low lung volumes. No pleural effusion or pneumothorax. Degenerative changes thoracic spine . IMPRESSION: 1. Cardiac pacer with lead tip over right ventricle. Stable mild cardiomegaly. No pulmonary venous congestion. 2. Low lung volumes.  No focal pulmonary alveolar infiltrate. Electronically Signed   By: Maisie Fus  Register   On: 03/30/2017 13:17   IMPRESSION AND PLAN:   * Vomiting and diarrhea with abdominal pain. Intra-abdominal infection. Viral versus bacterial. Check C. difficile and stool PCR. I will order a CT scan of the abdomen and pelvis without contrast as patient is not sure if he had any reaction to IV contrast in the  past. Start ciprofloxacin and Flagyl. Check blood cultures. Start IV fluids.  * Chronic systolic CHF. No signs of fluid overload. Continue IV fluids.  * Paroxysmal atrial fibrillation status post ablation. Presently has sinus tachycardia. Continue patient's Toprol from home.  * Diabetes mellitus. Sliding scale insulin.  * DVT prophylaxis. Patient is on Xarelto at home.  All the records are reviewed and  case discussed with ED provider. Management plans discussed with the patient, family and they are in agreement.  CODE STATUS: Full code  TOTAL TIME TAKING CARE OF THIS PATIENT: 40 minutes.   Milagros Loll R M.D on 03/30/2017 at 5:34 PM  Between 7am to 6pm - Pager - [phone]  After 6pm go to [email or web address] - password [email or web address] [facility]  [facility] [place] [facility]  Office  [phone]  CC: Primary care physician; Mickey Farber, MD  Note: This dictation was prepared with Dragon dictation along with smaller phrase technology. Any transcriptional errors that result from this process are unintentional.

## 2017-03-30 NOTE — ED Triage Notes (Addendum)
Pt reports to the ED for eval of abd pain, bloating, and N/V/D. Onset 2:30 this am. He states he was at a family event this weekend and ate some food there and he states that several people who were also at the event were sick as well. CBG 133 mg/dl PTA. 12 lead obtained en route showed sinus tach. Pt does have a significant cardiac hx. Denies any CP at this time. He does report some lightheadedness and SOB with exertion as well.

## 2017-03-30 NOTE — ED Provider Notes (Signed)
[facility] Emergency Department Provider Note  ____________________________________________   First MD Initiated Contact with Patient 03/30/17 1144     (approximate)  I have reviewed the triage vital signs and the nursing notes.   HISTORY  Chief Complaint Abdominal Pain   HPI Tyler Montgomery is a 56 y.o. male with a history of atrial fibrillation and cardiomyopathy who is presenting to the emergency department with nausea vomiting and diarrhea. He says that he was exposed to multiple sick contacts over the weekend. He says that as of 2 AM last night he began vomiting and having diarrhea. He denies any blood in the vomitus or diarrhea. Says that his stomach feels bloated but he denies any pain. However, he does say that he has some lower back cramping. He says that he has had 8 episodes of vomiting and 8 episodes of diarrhea. Denies any recent antibiotics. Says that he has a history of becoming dehydrated and eating fluids with his gastrointestinal illness is that he has had in the past. Denies any chest pain. However, he doesn't that he has been weak and short of breath since the beginning of this illness at 2 AM.   Past Medical History:  Diagnosis Date  . A-fib (HCC)   . Cardiomyopathy (HCC)     There are no active problems to display for this patient.   Past Surgical History:  Procedure Laterality Date  . EP IMPLANTABLE DEVICE    . JOINT REPLACEMENT    . NASAL SINUS SURGERY      Prior to Admission medications   Medication Sig Start Date End Date Taking? Authorizing Provider  Cholecalciferol ([employer]S D3) 2000 units CAPS Take 2,000 Units by mouth daily.   Yes [provider]  cyanocobalamin 1000 MCG tablet Take 1,000 mcg by mouth daily.   Yes [provider]  furosemide (LASIX) 40 MG tablet Take 80 mg by mouth daily. Take 2 tablets in the morning and 1 tablet in the evening. 07/02/15  Yes [provider]  losartan (COZAAR)  25 MG tablet Take 25 mg by mouth daily. 02/04/17 02/04/18 Yes [provider]  metoprolol succinate (TOPROL-XL) 25 MG 24 hr tablet Take 1 tablet by mouth daily. 07/02/15  Yes [provider]  omeprazole (PRILOSEC) 40 MG capsule Take 1 capsule by mouth daily. 07/17/15  Yes [provider]  potassium chloride (KLOR-CON) 20 MEQ packet Take 4 packets by mouth daily. 11/18/16  Yes [provider]  VIAGRA 50 MG tablet Take 50 mg by mouth daily as needed. 02/04/17  Yes [provider]  XARELTO 20 MG TABS tablet Take 1 tablet by mouth every evening. 06/12/15  Yes [provider]  naproxen (NAPROSYN) 500 MG tablet Take 1 tablet (500 mg total) by mouth 2 (two) times daily with a meal. Patient not taking: Reported on 03/30/2017 07/30/15   Sharman Cheek, MD  ondansetron (ZOFRAN ODT) 8 MG disintegrating tablet Take 1 tablet (8 mg total) by mouth every 8 (eight) hours as needed for nausea or vomiting. Patient not taking: Reported on 03/30/2017 07/30/15   Sharman Cheek, MD  ondansetron (ZOFRAN) 4 MG tablet Take 1 tablet (4 mg total) by mouth daily as needed for nausea or vomiting. Patient not taking: Reported on 03/30/2017 01/21/16   Emily Filbert, MD    Allergies Patient has no known allergies.  History reviewed. No pertinent family history.  Social History Social History  Substance Use Topics  . Smoking status: Former Smoker  Types: Cigarettes  . Smokeless tobacco: Never Used  . Alcohol use No    Review of Systems  Constitutional: No fever/chills Eyes: No visual changes. ENT: No sore throat. Cardiovascular: Denies chest pain. Respiratory: as above Gastrointestinal: No abdominal pain. No constipation. Genitourinary: Negative for dysuria. Musculoskeletal: Negative for back pain. Skin: Negative for rash. Neurological: Negative for headaches, focal weakness or numbness.   ____________________________________________   PHYSICAL  EXAM:  VITAL SIGNS: ED Triage Vitals [03/30/17 1143]  Enc Vitals Group     BP      Pulse      Resp      Temp      Temp src      SpO2      Weight      Height      Head Circumference      Peak Flow      Pain Score 6     Pain Loc      Pain Edu?      Excl. in GC?     Constitutional: Alert and oriented. Well appearing and in no acute distress. Eyes: Conjunctivae are normal. PERRL. EOMI. Head: Atraumatic. Nose: No congestion/rhinnorhea. Mouth/Throat: Mucous membranes are Dry Neck: No stridor.   Cardiovascular: tachycardic, regular rhythm. Grossly normal heart sounds.   Respiratory: Normal respiratory effort.  No retractions. Lungs CTAB. Gastrointestinal: Soft and nontender. No distention. No CVA tenderness. Musculoskeletal: No lower extremity tenderness nor edema.  No joint effusions.  No tenderness to the thoracic or lumbar spines. No deformity or step-off. Neurologic:  Normal speech and language. No gross focal neurologic deficits are appreciated.  Skin:  Skin is warm, dry and intact. No rash noted. Psychiatric: Mood and affect are normal. Speech and behavior are normal.  ____________________________________________   LABS (all labs ordered are listed, but only abnormal results are displayed)  Labs Reviewed  CBC WITH DIFFERENTIAL/PLATELET - Abnormal; Notable for the following:       Result Value   Neutro Abs 6.6 (*)    Lymphs Abs 0.2 (*)    All other components within normal limits  COMPREHENSIVE METABOLIC PANEL - Abnormal; Notable for the following:    Potassium 3.3 (*)    Glucose, Bld 124 (*)    Calcium 8.8 (*)    ALT 15 (*)    Total Bilirubin 1.3 (*)    All other components within normal limits  LACTIC ACID, PLASMA - Abnormal; Notable for the following:    Lactic Acid, Venous 3.0 (*)    All other components within normal limits  LACTIC ACID, PLASMA - Abnormal; Notable for the following:    Lactic Acid, Venous 2.0 (*)    All other components within normal  limits  URINALYSIS, ROUTINE W REFLEX MICROSCOPIC - Abnormal; Notable for the following:    Color, Urine YELLOW (*)    APPearance CLEAR (*)    All other components within normal limits  CULTURE, BLOOD (ROUTINE X 2)  CULTURE, BLOOD (ROUTINE X 2)  URINE CULTURE  LIPASE, BLOOD  TROPONIN I   ____________________________________________  EKG  ED ECG REPORT I, Arelia Longest, the attending physician, personally viewed and interpreted this ECG.   Date: 03/30/2017  EKG Time: 1142  Rate: 125  Rhythm: sinus tachycardia  Axis: normal  Intervals:none  ST&T Change: No ST segment elevation or depression. T-wave inversions in 1 as well as aVL.  ____________________________________________  RADIOLOGY    DG Chest 1 View (Final result)  Result time 03/30/17 13:17:51  Final  result by Register, Lavon Paganinihomas Jr., MD (03/30/17 13:17:51)           Narrative:   CLINICAL DATA: Shortness of breath .  EXAM: CHEST 1 VIEW  COMPARISON: 01/28/2009 .  FINDINGS: Cardiac pacer with lead tip over the right ventricle. Stable mild cardiomegaly. Mediastinum hilar structures normal . Low lung volumes. No pleural effusion or pneumothorax. Degenerative changes thoracic spine .  IMPRESSION: 1. Cardiac pacer with lead tip over right ventricle. Stable mild cardiomegaly. No pulmonary venous congestion.  2. Low lung volumes. No focal pulmonary alveolar infiltrate.   Electronically Signed By: Maisie Fushomas Register On: 03/30/2017 13:17          ____________________________________________   PROCEDURES  Procedure(s) performed:   Procedures  Critical Care performed:   ____________________________________________   INITIAL IMPRESSION / ASSESSMENT AND PLAN / ED COURSE  Pertinent labs & imaging results that were available during my care of the patient were reviewed by me and considered in my medical decision making (see chart for  details).  ----------------------------------------- 5:28 PM on 03/30/2017 -----------------------------------------  After 2 L of IV fluids the patient's lactic acid has only come down to 2. He is still tachycardic to about 1:15. Unclear source. Possible viral versus bacteremia. Will be admitted to the hospital. Sepsis alert was called. Patient given broad-spectrum antibiotics. Patient aware of the admitting diagnosis and the need to stay in the hospital. Signed out to Dr. Elpidio AnisSudini.      ____________________________________________   FINAL CLINICAL IMPRESSION(S) / ED DIAGNOSES  Sepsis.    NEW MEDICATIONS STARTED DURING THIS VISIT:  New Prescriptions   No medications on file     Note:  This document was prepared using Dragon voice recognition software and may include unintentional dictation errors.    Myrna BlazerSchaevitz, David Matthew, MD 03/30/17 [phone]

## 2017-03-30 NOTE — ED Notes (Signed)
CODE  SEPSIS  CALLED  TO  THOMAS  AT  [facility] 

## 2017-03-30 NOTE — Progress Notes (Signed)
Anticoagulation Monitoring:   Patient ordered rivaroxaban 20mg  and enoxaparin 40mg . Per protocol, will discontinue enoxaparin.   Pharmacy will continue to monitor and adjust per protocol.   MLS [id number]

## 2017-03-31 LAB — GASTROINTESTINAL PANEL BY PCR, STOOL (REPLACES STOOL CULTURE)
ADENOVIRUS F40/41: NOT DETECTED
ASTROVIRUS: NOT DETECTED
CAMPYLOBACTER SPECIES: NOT DETECTED
Cryptosporidium: NOT DETECTED
Cyclospora cayetanensis: NOT DETECTED
ENTEROPATHOGENIC E COLI (EPEC): NOT DETECTED
ENTEROTOXIGENIC E COLI (ETEC): NOT DETECTED
Entamoeba histolytica: NOT DETECTED
Enteroaggregative E coli (EAEC): NOT DETECTED
Giardia lamblia: NOT DETECTED
NOROVIRUS GI/GII: DETECTED — AB
PLESIMONAS SHIGELLOIDES: NOT DETECTED
ROTAVIRUS A: NOT DETECTED
SHIGA LIKE TOXIN PRODUCING E COLI (STEC): NOT DETECTED
Salmonella species: NOT DETECTED
Sapovirus (I, II, IV, and V): NOT DETECTED
Shigella/Enteroinvasive E coli (EIEC): NOT DETECTED
VIBRIO SPECIES: NOT DETECTED
Vibrio cholerae: NOT DETECTED
Yersinia enterocolitica: NOT DETECTED

## 2017-03-31 LAB — BASIC METABOLIC PANEL
Anion gap: 6 (ref 5–15)
BUN: 12 mg/dL (ref 6–20)
CHLORIDE: 105 mmol/L (ref 101–111)
CO2: 24 mmol/L (ref 22–32)
Calcium: 7.6 mg/dL — ABNORMAL LOW (ref 8.9–10.3)
Creatinine, Ser: 0.92 mg/dL (ref 0.61–1.24)
GFR calc Af Amer: >60 mL/min (ref >60)
GFR calc non Af Amer: >60 mL/min (ref >60)
GLUCOSE: 113 mg/dL — AB (ref 65–99)
Potassium: 3.3 mmol/L — ABNORMAL LOW (ref 3.5–5.1)
Sodium: 135 mmol/L (ref 135–145)

## 2017-03-31 LAB — CBC
HCT: 38.5 % — ABNORMAL LOW (ref 40.0–52.0)
Hemoglobin: 13.3 g/dL (ref 13.0–18.0)
MCH: 30.3 pg (ref 26.0–34.0)
MCHC: 34.6 g/dL (ref 32.0–36.0)
MCV: 87.6 fL (ref 80.0–100.0)
Platelets: 143 10*3/uL — ABNORMAL LOW (ref 150–440)
RBC: 4.4 MIL/uL (ref 4.40–5.90)
RDW: 13.5 % (ref 11.5–14.5)
WBC: 5 10*3/uL (ref 3.8–10.6)

## 2017-03-31 LAB — HEMOGLOBIN A1C
Hgb A1c MFr Bld: 6.2 % — ABNORMAL HIGH (ref 4.8–5.6)
Mean Plasma Glucose: 131 mg/dL

## 2017-03-31 LAB — GLUCOSE, CAPILLARY
GLUCOSE-CAPILLARY: 107 mg/dL — AB (ref 65–99)
Glucose-Capillary: 108 mg/dL — ABNORMAL HIGH (ref 65–99)

## 2017-03-31 MED ORDER — FUROSEMIDE 40 MG PO TABS: 80.0000 mg | ORAL_TABLET | Freq: Every day | ORAL | 0 refills | Status: AC

## 2017-03-31 NOTE — Discharge Instructions (Signed)
° °  Abdominal Pain, Adult Many things can cause belly (abdominal) pain. Most times, belly pain is not dangerous. Many cases of belly pain can be watched and treated at home. Sometimes belly pain is serious, though. Your doctor will try to find the cause of your belly pain. Follow these instructions at home:  Take over-the-counter and prescription medicines only as told by your doctor. Do not take medicines that help you poop (laxatives) unless told to by your doctor.  Drink enough fluid to keep your pee (urine) clear or pale yellow.  Watch your belly pain for any changes.  Keep all follow-up visits as told by your doctor. This is important. Contact a doctor if:  Your belly pain changes or gets worse.  You are not hungry, or you lose weight without trying.  You are having trouble pooping (constipated) or have watery poop (diarrhea) for more than 2-3 days.  You have pain when you pee or poop.  Your belly pain wakes you up at night.  Your pain gets worse with meals, after eating, or with certain foods.  You are throwing up and cannot keep anything down.  You have a fever. Get help right away if:  Your pain does not go away as soon as your doctor says it should.  You cannot stop throwing up.  Your pain is only in areas of your belly, such as the right side or the left lower part of the belly.  You have bloody or black poop, or poop that looks like tar.  You have very bad pain, cramping, or bloating in your belly.  You have signs of not having enough fluid or water in your body (dehydration), such as:  Dark pee, very little pee, or no pee.  Cracked lips.  Dry mouth.  Sunken eyes.  Sleepiness.  Weakness. This information is not intended to replace advice given to you by your health care provider. Make sure you discuss any questions you have with your health care provider. Document Released: 04/21/2008 Document Revised: 05/23/2016 Document Reviewed: 04/16/2016 Elsevier  Interactive Patient Education  2017 [employer] DO NOT TAKE LASIX TOMORROW, START THE DAY AFTER THAT.

## 2017-03-31 NOTE — Progress Notes (Signed)
Pt wanting to go home. md spoke with pt this am re this. md notified.  Pt to be discharged home with spouse.

## 2017-03-31 NOTE — Progress Notes (Signed)
Pt for discharge home. A/o. No resp distress.  Instructions discussedwith pt and wife.   meds discussed.  Diet activity and f/u  Discussed.  Verbalized understanding of  All. Home via w/c at this time.

## 2017-03-31 NOTE — Discharge Summary (Signed)
[facility] [place] at [facility]   PATIENT NAME: Tyler Montgomery    MR#:  [id number]  DATE OF BIRTH:  12/12/1960  DATE OF ADMISSION:  03/30/2017 ADMITTING PHYSICIAN: Milagros Loll, MD  DATE OF DISCHARGE: 03/31/2017  PRIMARY CARE PHYSICIAN: Mickey Farber, MD    ADMISSION DIAGNOSIS:  Abdominal pain [R10.9] Sepsis, due to unspecified organism [facility]) [A41.9]  DISCHARGE DIAGNOSIS:  Active Problems:   Sepsis (HCC)   Norvo virus infection.  SECONDARY DIAGNOSIS:   Past Medical History:  Diagnosis Date  . A-fib (HCC)   . Cardiomyopathy (HCC)   . Diabetes [facility])     HOSPITAL COURSE:    * Vomiting and diarrhea with abdominal pain. Intra-abdominal infection.  Negative for C. difficile and stool PCR showed norvo virus infection. negative CT scan of the abdomen and pelvis without contrast. Started ciprofloxacin and Flagyl. Given IV fluids, but later improved, so no need for abx on d/c.  * Chronic systolic CHF. No signs of fluid overload. Continue IV fluids.   Due to diarrhea, advised - NOT to take lasix tomorrow, then re-start.  * Paroxysmal atrial fibrillation status post ablation. Presently has sinus tachycardia. Continue patient's Toprol from home.  * Diabetes mellitus. Sliding scale insulin.  * DVT prophylaxis. Patient is on Xarelto at home.  DISCHARGE CONDITIONS:   Stable.  CONSULTS OBTAINED:    DRUG ALLERGIES:  No Known Allergies  DISCHARGE MEDICATIONS:   Current Discharge Medication List    CONTINUE these medications which have CHANGED   Details  furosemide (LASIX) 40 MG tablet Take 2 tablets (80 mg total) by mouth daily. Take 2 tablets in the morning and 1 tablet in the evening. Qty: 30 tablet, Refills: 0      CONTINUE these medications which have NOT CHANGED   Details  Cholecalciferol ([employer] D3) 2000 units CAPS Take 2,000 Units by mouth daily.    cyanocobalamin 1000 MCG tablet Take 1,000 mcg by mouth daily.    losartan  (COZAAR) 25 MG tablet Take 25 mg by mouth daily.    metoprolol succinate (TOPROL-XL) 25 MG 24 hr tablet Take 1 tablet by mouth daily. Refills: 3    omeprazole (PRILOSEC) 40 MG capsule Take 1 capsule by mouth daily. Refills: 4    potassium chloride (KLOR-CON) 20 MEQ packet Take 4 packets by mouth daily.    VIAGRA 50 MG tablet Take 50 mg by mouth daily as needed. Refills: 0    XARELTO 20 MG TABS tablet Take 1 tablet by mouth every evening. Refills: 6    naproxen (NAPROSYN) 500 MG tablet Take 1 tablet (500 mg total) by mouth 2 (two) times daily with a meal. Qty: 20 tablet, Refills: 0    ondansetron (ZOFRAN ODT) 8 MG disintegrating tablet Take 1 tablet (8 mg total) by mouth every 8 (eight) hours as needed for nausea or vomiting. Qty: 20 tablet, Refills: 0    ondansetron (ZOFRAN) 4 MG tablet Take 1 tablet (4 mg total) by mouth daily as needed for nausea or vomiting. Qty: 20 tablet, Refills: 1         DISCHARGE INSTRUCTIONS:    Follow with PMD in 1-2 weeks.  If you experience worsening of your admission symptoms, develop shortness of breath, life threatening emergency, suicidal or homicidal thoughts you must seek medical attention immediately by calling 911 or calling your MD immediately  if symptoms less severe.  You Must read complete instructions/literature along with all the possible adverse reactions/side effects for all the Medicines  you take and that have been prescribed to you. Take any new Medicines after you have completely understood and accept all the possible adverse reactions/side effects.   Please note  You were cared for by a hospitalist during your hospital stay. If you have any questions about your discharge medications or the care you received while you were in the hospital after you are discharged, you can call the unit and asked to speak with the hospitalist on call if the hospitalist that took care of you is not available. Once you are discharged, your primary  care physician will handle any further medical issues. Please note that NO REFILLS for any discharge medications will be authorized once you are discharged, as it is imperative that you return to your primary care physician (or establish a relationship with a primary care physician if you do not have one) for your aftercare needs so that they can reassess your need for medications and monitor your lab values.    Today   CHIEF COMPLAINT:   Chief Complaint  Patient presents with  . Abdominal Pain    HISTORY OF PRESENT ILLNESS:  Tyler Montgomery  is a 56 y.o. male with a known history of Paroxysmal atrial fibrillation status post ablation, hypertension, diabetes, chronic systolic CHF with ejection fraction 45% presents to the emergency room complaining of 2 days of nausea, vomiting, diarrhea and abdominal pain. He was at a family get together barbecue. Multiple family members have similar symptoms although no one has been admitted to the hospital. He has had 8-10 watery stools for 2 days now. He has been trying to drink plenty of fluids but feels lightheaded and dizzy. Chills. Here in the emergency room he has temperature 102 with lactic acid of 3. Given 2 L normal saline. Continue stool feel bad with nausea. Tachycardic in the 120s sinus tachycardia. Patient is being admitted with sepsis.  VITAL SIGNS:  Blood pressure 117/70, pulse 97, temperature 99.3 F (37.4 C), temperature source Oral, resp. rate 18, height 6\' 4"  (1.93 m), weight 134.9 kg (297 lb 8 oz), SpO2 96 %.  I/O:   Intake/Output Summary (Last 24 hours) at 03/31/17 1419 Last data filed at 03/31/17 0118  Gross per 24 hour  Intake           1512.5 ml  Output                0 ml  Net           1512.5 ml    PHYSICAL EXAMINATION:  GENERAL:  56 y.o.-year-old patient lying in the bed with no acute distress.  EYES: Pupils equal, round, reactive to light and accommodation. No scleral icterus. Extraocular muscles intact.  HEENT: Head  atraumatic, normocephalic. Oropharynx and nasopharynx clear.  NECK:  Supple, no jugular venous distention. No thyroid enlargement, no tenderness.  LUNGS: Normal breath sounds bilaterally, no wheezing, rales,rhonchi or crepitation. No use of accessory muscles of respiration.  CARDIOVASCULAR: S1, S2 normal. No murmurs, rubs, or gallops.  ABDOMEN: Soft, non-tender, non-distended. Bowel sounds present. No organomegaly or mass.  EXTREMITIES: No pedal edema, cyanosis, or clubbing.  NEUROLOGIC: Cranial nerves II through XII are intact. Muscle strength 5/5 in all extremities. Sensation intact. Gait not checked.  PSYCHIATRIC: The patient is alert and oriented x 3.  SKIN: No obvious rash, lesion, or ulcer.   DATA REVIEW:   CBC  Recent Labs Lab 03/31/17 0526  WBC 5.0  HGB 13.3  HCT 38.5*  PLT 143*  Chemistries   Recent Labs Lab 03/30/17 1144 03/31/17 0526  NA 139 135  K 3.3* 3.3*  CL 105 105  CO2 24 24  GLUCOSE 124* 113*  BUN 19 12  CREATININE 1.14 0.92  CALCIUM 8.8* 7.6*  AST 28  --   ALT 15*  --   ALKPHOS 60  --   BILITOT 1.3*  --     Cardiac Enzymes  Recent Labs Lab 03/30/17 1144  TROPONINI <0.03    Microbiology Results  Results for orders placed or performed during the hospital encounter of 03/30/17  Blood culture (routine x 2)     Status: None (Preliminary result)   Collection Time: 03/30/17  1:11 PM  Result Value Ref Range Status   Specimen Description BLOOD RIGHT HAND  Final   Special Requests Blood Culture adequate volume  Final   Culture NO GROWTH < 24 HOURS  Final   Report Status PENDING  Incomplete  Blood culture (routine x 2)     Status: None (Preliminary result)   Collection Time: 03/30/17  1:12 PM  Result Value Ref Range Status   Specimen Description BLOOD LEFT HAND  Final   Special Requests Blood Culture adequate volume  Final   Culture NO GROWTH < 24 HOURS  Final   Report Status PENDING  Incomplete  C difficile quick scan w PCR reflex      Status: None   Collection Time: 03/30/17  9:20 PM  Result Value Ref Range Status   C Diff antigen NEGATIVE NEGATIVE Final   C Diff toxin NEGATIVE NEGATIVE Final   C Diff interpretation No C. difficile detected.  Final  Gastrointestinal Panel by PCR , Stool     Status: Abnormal   Collection Time: 03/30/17  9:20 PM  Result Value Ref Range Status   Campylobacter species NOT DETECTED NOT DETECTED Final   Plesimonas shigelloides NOT DETECTED NOT DETECTED Final   Salmonella species NOT DETECTED NOT DETECTED Final   Yersinia enterocolitica NOT DETECTED NOT DETECTED Final   Vibrio species NOT DETECTED NOT DETECTED Final   Vibrio cholerae NOT DETECTED NOT DETECTED Final   Enteroaggregative E coli (EAEC) NOT DETECTED NOT DETECTED Final   Enteropathogenic E coli (EPEC) NOT DETECTED NOT DETECTED Final   Enterotoxigenic E coli (ETEC) NOT DETECTED NOT DETECTED Final   Shiga like toxin producing E coli (STEC) NOT DETECTED NOT DETECTED Final   Shigella/Enteroinvasive E coli (EIEC) NOT DETECTED NOT DETECTED Final   Cryptosporidium NOT DETECTED NOT DETECTED Final   Cyclospora cayetanensis NOT DETECTED NOT DETECTED Final   Entamoeba histolytica NOT DETECTED NOT DETECTED Final   Giardia lamblia NOT DETECTED NOT DETECTED Final   Adenovirus F40/41 NOT DETECTED NOT DETECTED Final   Astrovirus NOT DETECTED NOT DETECTED Final   Norovirus GI/GII DETECTED (A) NOT DETECTED Final    Comment: CRITICAL RESULT CALLED TO, READ BACK BY AND VERIFIED WITH: CYNTHIA BURGESS @ 0008 ON 03/31/2017 BY [facility]    Rotavirus A NOT DETECTED NOT DETECTED Final   Sapovirus (I, II, IV, and V) NOT DETECTED NOT DETECTED Final    RADIOLOGY:  Ct Abdomen Pelvis Wo Contrast  Result Date: 03/30/2017 CLINICAL DATA:  Two days of nausea, vomiting and diarrhea with abdominal pain EXAM: CT ABDOMEN AND PELVIS WITHOUT CONTRAST TECHNIQUE: Multidetector CT imaging of the abdomen and pelvis was performed following the standard protocol without IV  contrast. COMPARISON:  07/30/2015 FINDINGS: Lower chest: Normal size cardiac chambers. No pericardial effusion. Bibasilar atelectasis. Partially visualized ICD leads in  the right ventricle. Hepatobiliary: Normal Pancreas: Atrophy without space-occupying mass or inflammation. Spleen: Normal Adrenals/Urinary Tract: Normal bilateral adrenal glands. No obstructive uropathy or focal renal mass. Nonspecific mild perinephric fat stranding is again visualized. No hydroureteronephrosis. Normal appearing bladder. Stomach/Bowel: Contracted stomach. Normal small bowel rotation without bowel obstruction. Contrast reaches the rectum. Normal appendix. No large bowel inflammation. No annular constricting lesions. Vascular/Lymphatic: Normal caliber aorta.  No adenopathy. Reproductive: Prostate is unremarkable. Other: No abdominal wall hernia or abnormality. No abdominopelvic ascites. Musculoskeletal: Thoracolumbar spondylosis with multilevel disc space narrowing and endplate spurring. No acute osseous abnormality. IMPRESSION: No acute abdominal nor pelvic abnormality. Electronically Signed   By: Tollie Eth M.D.   On: 03/30/2017 20:27   Dg Chest 1 View  Result Date: 03/30/2017 CLINICAL DATA:  Shortness of breath . EXAM: CHEST 1 VIEW COMPARISON:  01/28/2009 . FINDINGS: Cardiac pacer with lead tip over the right ventricle. Stable mild cardiomegaly. Mediastinum hilar structures normal . Low lung volumes. No pleural effusion or pneumothorax. Degenerative changes thoracic spine . IMPRESSION: 1. Cardiac pacer with lead tip over right ventricle. Stable mild cardiomegaly. No pulmonary venous congestion. 2. Low lung volumes.  No focal pulmonary alveolar infiltrate. Electronically Signed   By: Maisie Fus  Register   On: 03/30/2017 13:17    EKG:   Orders placed or performed during the hospital encounter of 01/21/16  . ED EKG  . ED EKG      Management plans discussed with the patient, family and they are in agreement.  CODE  STATUS:     Code Status Orders        Start     Ordered   03/30/17 1732  Full code  Continuous     03/30/17 1732    Code Status History    Date Active Date Inactive Code Status Order ID Comments User Context   This patient has a current code status but no historical code status.      TOTAL TIME TAKING CARE OF THIS PATIENT: 35 minutes.    Altamese Dilling M.D on 03/31/2017 at 2:19 PM  Between 7am to 6pm - Pager - [phone]  After 6pm go to [email or web address] - Social research officer, government  [facility]  Office  [phone]  CC: Primary care physician; Mickey Farber, MD   Note: This dictation was prepared with Dragon dictation along with smaller phrase technology. Any transcriptional errors that result from this process are unintentional.

## 2017-03-31 NOTE — Progress Notes (Addendum)
Lab called with results of norovirus.  Education provided, isolation precautions reinforced, patient states understanding.  MD notified

## 2017-04-01 LAB — URINE CULTURE: Culture: NO GROWTH

## 2017-04-04 LAB — CULTURE, BLOOD (ROUTINE X 2)
CULTURE: NO GROWTH
Culture: NO GROWTH
Special Requests: ADEQUATE
Special Requests: ADEQUATE

## 2017-12-25 ENCOUNTER — Other Ambulatory Visit: Payer: Self-pay | Admitting: Internal Medicine

## 2017-12-25 DIAGNOSIS — R1032 Left lower quadrant pain: Secondary | ICD-10-CM

## 2017-12-31 ENCOUNTER — Ambulatory Visit
Admission: RE | Admit: 2017-12-31 | Discharge: 2017-12-31 | Disposition: A | Discharge status: Home / Self Care | Payer: BLUE CROSS/BLUE SHIELD | Source: Ambulatory Visit | Attending: Internal Medicine | Admitting: Internal Medicine

## 2017-12-31 DIAGNOSIS — R1032 Left lower quadrant pain: Secondary | ICD-10-CM

## 2018-01-06 ENCOUNTER — Other Ambulatory Visit: Payer: Self-pay | Admitting: Unknown Physician Specialty

## 2018-01-06 DIAGNOSIS — M545 Low back pain, unspecified: Secondary | ICD-10-CM

## 2018-01-06 DIAGNOSIS — G8929 Other chronic pain: Secondary | ICD-10-CM

## 2018-01-06 DIAGNOSIS — M25512 Pain in left shoulder: Secondary | ICD-10-CM

## 2018-01-27 ENCOUNTER — Ambulatory Visit
Admission: RE | Admit: 2018-01-27 | Discharge: 2018-01-27 | Disposition: A | Discharge status: Home / Self Care | Payer: BLUE CROSS/BLUE SHIELD | Source: Ambulatory Visit | Attending: Unknown Physician Specialty | Admitting: Unknown Physician Specialty

## 2018-01-27 DIAGNOSIS — M25512 Pain in left shoulder: Secondary | ICD-10-CM | POA: Diagnosis present

## 2018-01-27 DIAGNOSIS — G8929 Other chronic pain: Secondary | ICD-10-CM | POA: Insufficient documentation

## 2018-01-27 DIAGNOSIS — M545 Low back pain, unspecified: Secondary | ICD-10-CM

## 2018-01-27 MED ORDER — IOPAMIDOL (ISOVUE-200) INJECTION 41%
50.0000 mL | Freq: Once | INTRAVENOUS | Status: AC | PRN
  Administered 2018-01-27: 10 mL
  Filled 2018-01-27: qty 50

## 2018-01-27 MED ORDER — LIDOCAINE HCL (PF) 1 % IJ SOLN
5.0000 mL | Freq: Once | INTRAMUSCULAR | Status: AC
  Administered 2018-01-27: 5 mL
  Filled 2018-01-27: qty 5

## 2019-12-21 IMAGING — CT CT L SPINE W/O CM
3 of 4 series · 12 of 33 positions shown, 14 images · non-contrast
Comparison: CT abdomen 03/30/2017

CLINICAL DATA: Injury in [REDACTED] of last year. Persistent back and
leg pain since then.

EXAM:
CT LUMBAR SPINE WITHOUT CONTRAST
TECHNIQUE: Multidetector CT imaging of the lumbar spine was performed without
intravenous contrast administration. Multiplanar CT image
reconstructions were also generated.

[Series 5: sagittal bone · sagittal · 0.33mm/px · 5 of 83 slices shown, 6 images]
[im 28/83  bone]
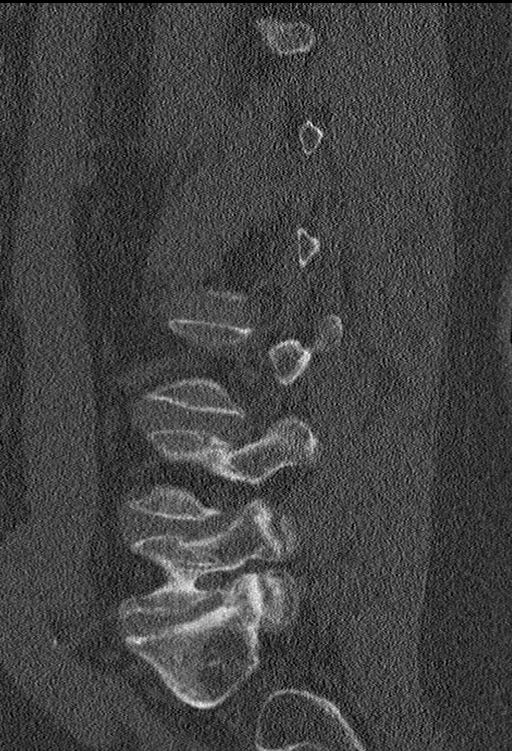
[im 35/83  bone]
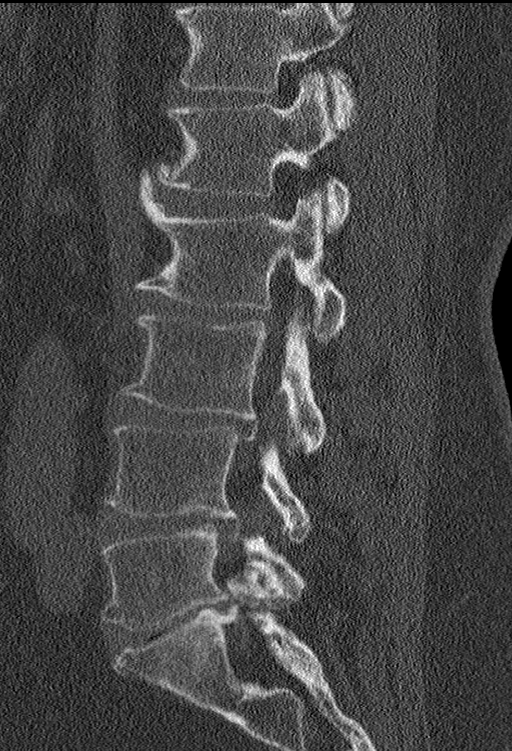
[im 42/83  soft-tissue]
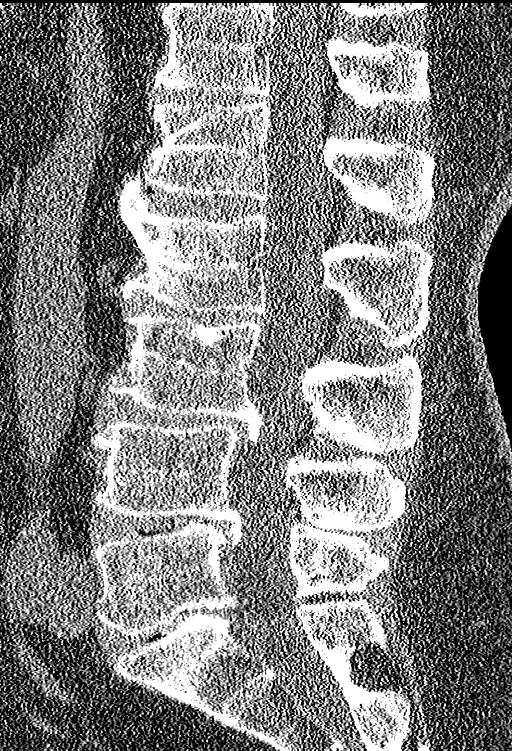
[im 42/83  bone]
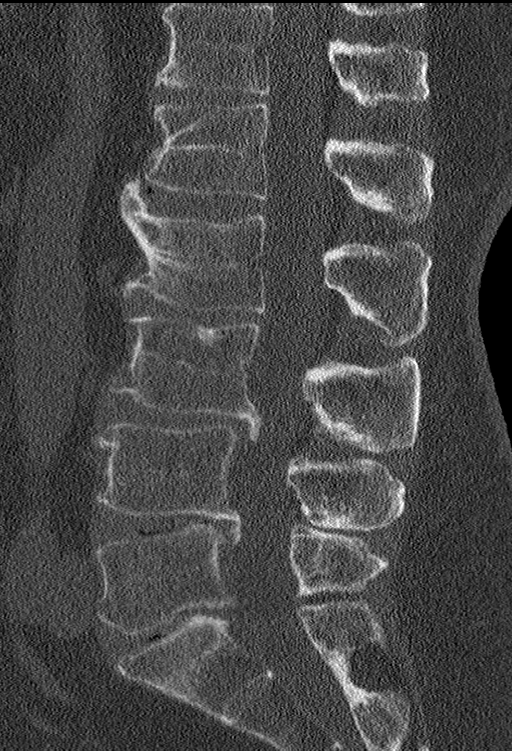
[im 48/83  bone]
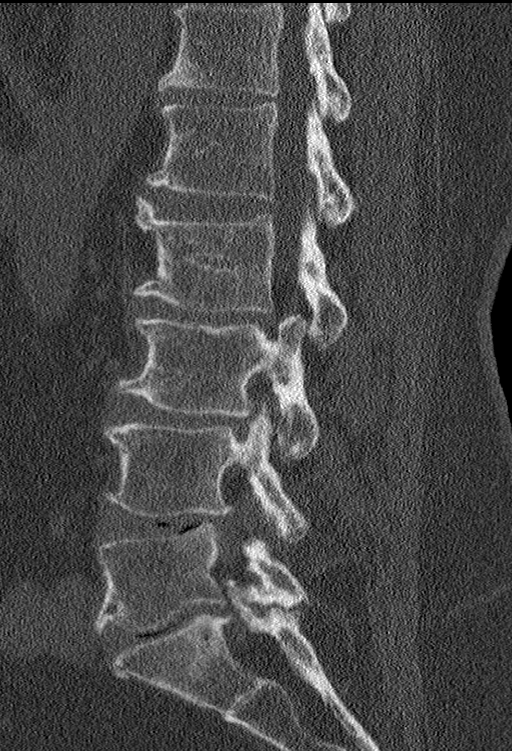
[im 55/83  bone]
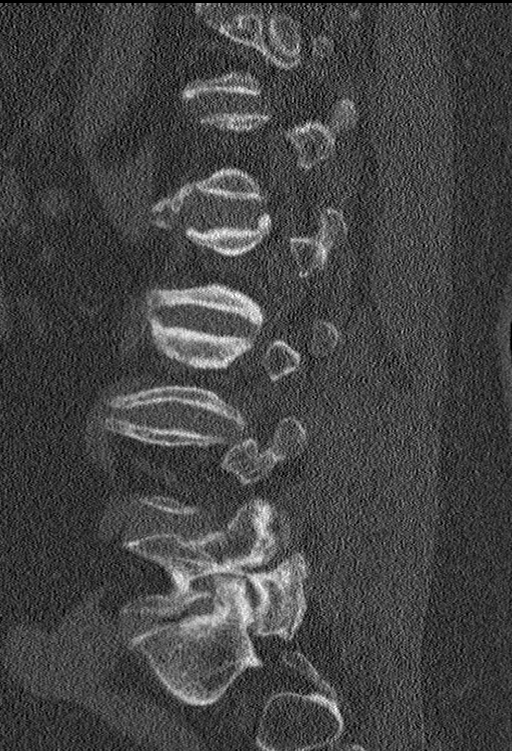

[Series 6: coronal bone · coronal · 0.36mm/px · 3 of 46 slices shown]
[im 10/46  bone]
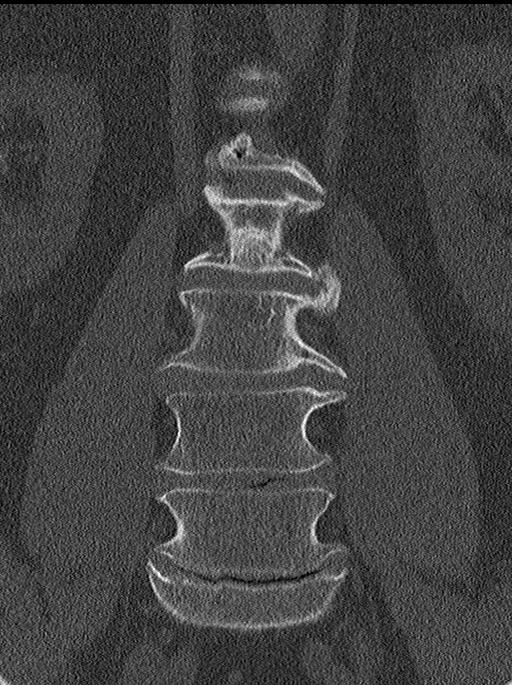
[im 19/46  bone]
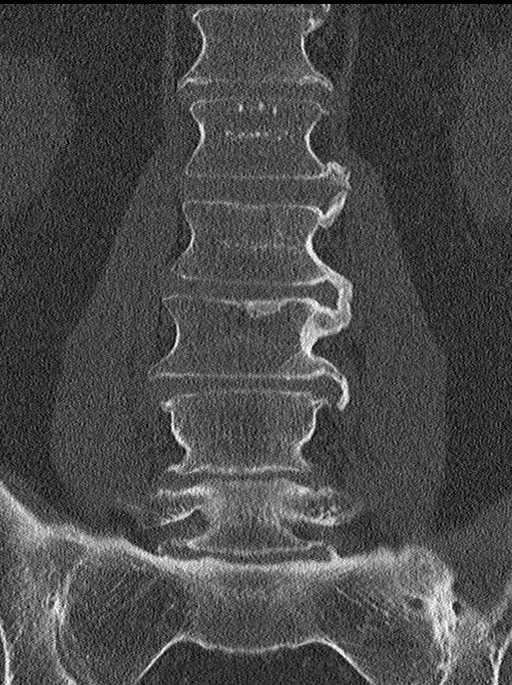
[im 28/46  bone]
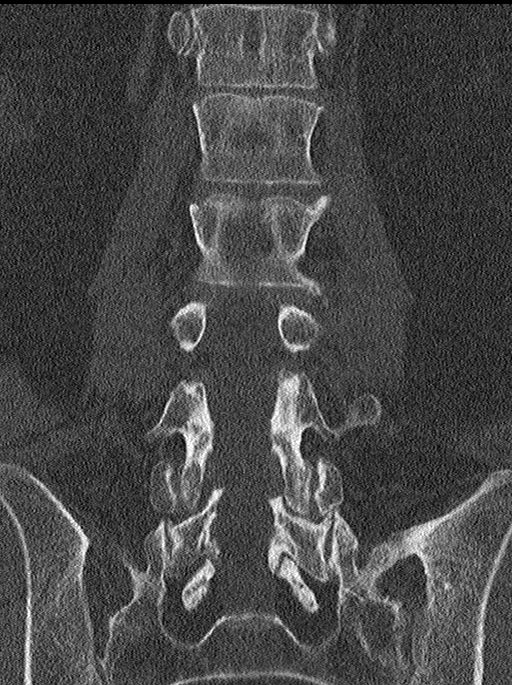

[Series 9: multi disc · axial · 0.22mm/px · z∈[-961,-792]mm · 4 of 111 slices shown, 5 images]
[im 19/111  soft-tissue]
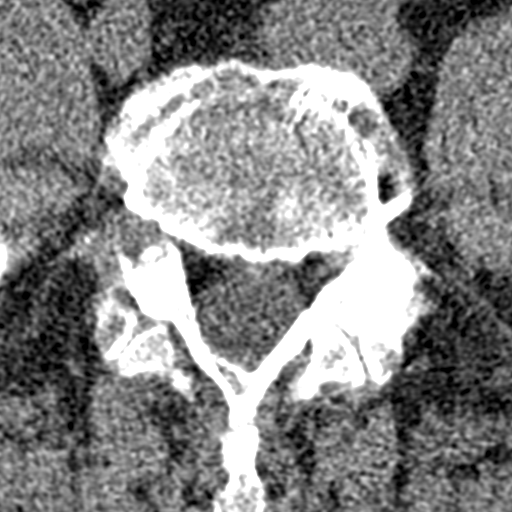
[im 19/111  bone]
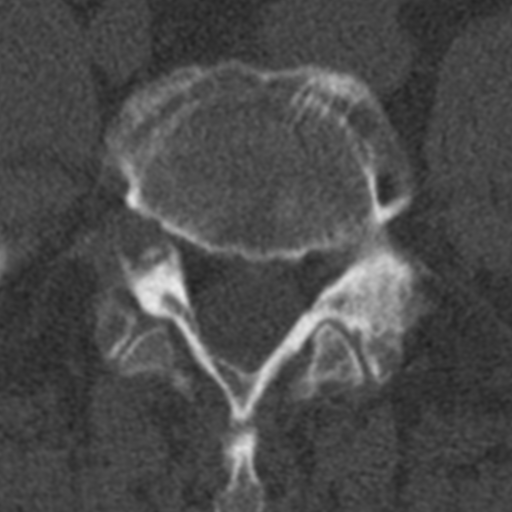
[im 37/111  bone]
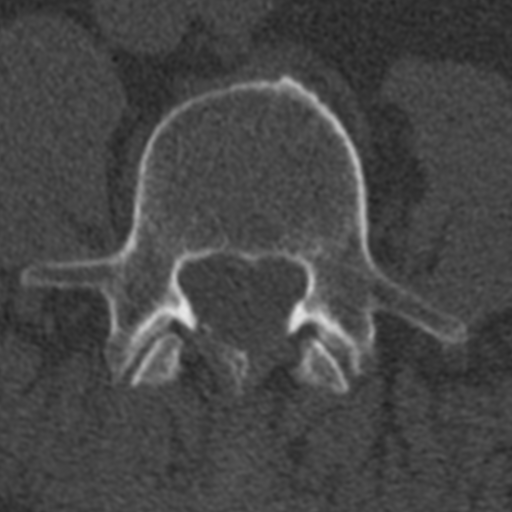
[im 74/111  bone]
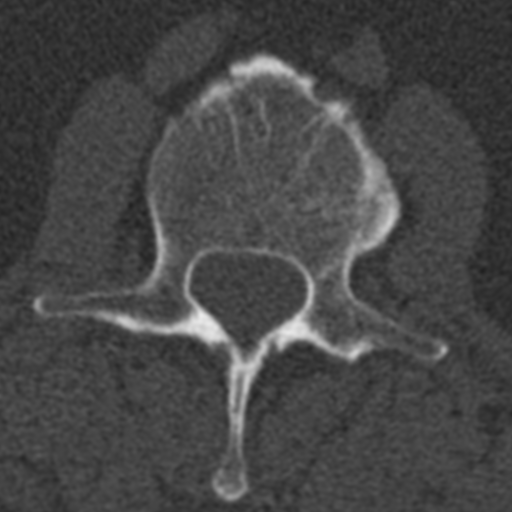
[im 92/111  bone]
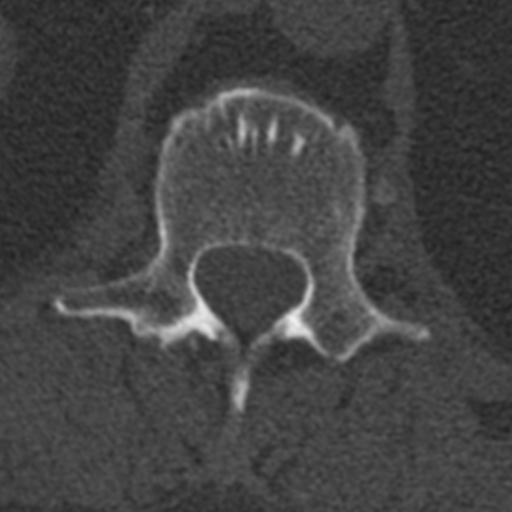

[12 of 33 positions shown; findings below may reference images not displayed]

FINDINGS: Segmentation: 5 lumbar type vertebral bodies.

Alignment: Straightening of the normal lumbar lordosis. 2 mm
retrolisthesis L3-4, L4-5 and L5-S1.

Vertebrae: No fracture or focal bone lesion.

Paraspinal and other soft tissues: Negative

Disc levels: T12-L1: No disc pathology. Mild facet arthritis. No
stenosis.

L1-2: Prominent bridging osteophytes. No disc bulge or herniation.
No stenosis.

L2-3: Prominent bridging osteophytes. No disc bulge or herniation.
No stenosis.

L3-4: Retrolisthesis of 2 mm. Annular calcification and annular
bulging. Stenosis of both lateral recesses. Mild bilateral foraminal
narrowing.

L4-5: Retrolisthesis of 2 mm. Annular calcification and annular
bulging. Stenosis of both lateral recesses. Mild bilateral foraminal
narrowing.

L5-S1: Retrolisthesis of 2 mm. Annular calcification and annular
bulging. Facet degeneration and hypertrophy. Stenosis of the
subarticular lateral recesses. Foraminal stenosis worse on the right
because of osteophytic encroachment. Neural compression possible
this level, particularly in the foramina.

Solid bridging sacroiliac osteophytes.
IMPRESSION: L5-S1: Stenosis of the neural foramina and lateral recesses right
more than left that could cause neural compression on either or both
sides. Contributing factors include retrolisthesis, annular
calcification and annular bulging and pronounced facet hypertrophic
change.

L3-4 and L4-5: 2 mm retrolisthesis. Annular calcification and
bulging. Bilateral lateral recess stenosis that could possibly be
symptomatic. Mild foraminal narrowing probably without compressive
stenosis.

## 2020-03-10 LAB — EXTERNAL GENERIC LAB PROCEDURE: COLOGUARD: NEGATIVE

## 2020-03-10 LAB — COLOGUARD: COLOGUARD: NEGATIVE

## 2022-04-22 ENCOUNTER — Other Ambulatory Visit: Payer: Self-pay | Admitting: Family Medicine

## 2022-04-22 ENCOUNTER — Ambulatory Visit
Admission: RE | Admit: 2022-04-22 | Discharge: 2022-04-22 | Disposition: A | Discharge status: Home / Self Care | Payer: BC Managed Care – PPO | Source: Ambulatory Visit | Attending: Family Medicine | Admitting: Family Medicine

## 2022-04-22 DIAGNOSIS — N5089 Other specified disorders of the male genital organs: Secondary | ICD-10-CM

## 2022-04-22 DIAGNOSIS — L0390 Cellulitis, unspecified: Secondary | ICD-10-CM | POA: Diagnosis not present

## 2022-04-24 ENCOUNTER — Other Ambulatory Visit: Payer: Self-pay

## 2022-04-24 DIAGNOSIS — N5082 Scrotal pain: Secondary | ICD-10-CM

## 2022-04-25 ENCOUNTER — Other Ambulatory Visit
Admission: RE | Admit: 2022-04-25 | Discharge: 2022-04-25 | Disposition: A | Discharge status: Home / Self Care | Payer: BC Managed Care – PPO | Attending: Urology | Admitting: Urology

## 2022-04-25 ENCOUNTER — Ambulatory Visit: Payer: BC Managed Care – PPO | Admitting: Urology

## 2022-04-25 VITALS — BP 120/74 | HR 80 | Ht 73.0 in | Wt 307.0 lb

## 2022-04-25 DIAGNOSIS — N501 Vascular disorders of male genital organs: Secondary | ICD-10-CM | POA: Diagnosis not present

## 2022-04-25 DIAGNOSIS — N5082 Scrotal pain: Secondary | ICD-10-CM | POA: Diagnosis present

## 2022-04-25 DIAGNOSIS — N453 Epididymo-orchitis: Secondary | ICD-10-CM | POA: Diagnosis not present

## 2022-04-25 LAB — URINALYSIS, COMPLETE (UACMP) WITH MICROSCOPIC
Bilirubin Urine: NEGATIVE
Glucose, UA: NEGATIVE mg/dL
Ketones, ur: NEGATIVE mg/dL
Leukocytes,Ua: NEGATIVE
Nitrite: NEGATIVE
Protein, ur: NEGATIVE mg/dL
Specific Gravity, Urine: 1.02 (ref 1.005–1.030)
pH: 7 (ref 5.0–8.0)

## 2022-04-25 NOTE — Patient Instructions (Signed)
Orchitis  Orchitis is inflammation of a testicle. Testicles are the male organs that produce sperm. The testicles are held in a fleshy sac (scrotum) located behind the penis. Orchitis usually affects only one testicle, but it can affect both. Orchitis is caused by infection. Many kinds of bacteria and viruses can cause this infection. The condition can develop suddenly. What are the causes? This condition may be caused by: Infection from viruses or bacteria. Other organisms, such as fungi or parasites. This is rare but can happen in men who have a weak body defense system (immune system), such as men who have HIV. Bacteria  Bacterial orchitis often occurs along with an infection of the tube that collects and stores sperm (epididymis). In men who are not sexually active, this infection usually starts as a urinary tract infection and spreads to the testicle. In sexually active men, sexually transmitted infections (STIs) are the most common cause of bacterial orchitis. These can include: Gonorrhea. Chlamydia. Viruses Mumps is the most common cause of viral orchitis, though mumps is now rare in many areas because of vaccination. Other viruses that can cause orchitis include: The chickenpox virus (varicella-zoster virus). The virus that causes mononucleosis (Epstein-Barr virus). What increases the risk? The following factors may make you more likely to develop this condition: For viral orchitis: Not having been vaccinated against mumps. For bacterial orchitis: Having had frequent urinary tract infections. Engaging in high-risk sexual behaviors, such as having multiple sexual partners or having sex without using a condom. Having a sexual partner with an STI. Having had urinary tract surgery. Using a tube that is passed through the penis to drain urine (Foley catheter). Having an enlarged prostate gland. What are the signs or symptoms? The most common symptoms of orchitis are swelling and  pain in the scrotum. Other signs and symptoms may include: Feeling generally sick (malaise). Fever and chills. Painful urination. Painful ejaculation. Headache. Fatigue. Nausea. Blood or discharge from the penis. Swollen lymph nodes in the groin area (inguinal nodes). How is this diagnosed? This condition may be diagnosed based on: Your symptoms. Your health care provider may suspect orchitis if you have a painful, swollen testicle along with other signs and symptoms of the condition. A physical exam. You may also have other tests, including: A blood test to check for signs of infection. A urine test to check for a urinary tract infection or STI. Using a swab to collect a fluid sample from the tip of the penis to test for STIs. Taking an image of the testicle using sound waves and a computer (testicular ultrasound). How is this treated? Treatment for this condition depends on the cause.  For bacterial orchitis, your health care provider may prescribe antibiotic medicines. Bacterial infections usually clear up within a few days. For both viral infections and bacterial infections, treatment may include: Rest. Anti-inflammatory medicines. Pain medicines. Raising (elevating) the scrotum with a towel or pillow and applying ice. Follow these instructions at home: Managing pain and swelling Elevate your scrotum and apply ice as directed. To do this: Put ice in a plastic bag. Place a small towel or pillow between your legs. Rest your scrotum on the pillow or towel. Place another towel between your skin and the plastic bag. Leave the ice on for 20 minutes, 2-3 times a day. Remove the ice if your skin turns bright red. This is very important. If you cannot feel pain, heat, or cold, you have a greater risk of damage to the area. General instructions   Rest as told by your health care provider. Take over-the-counter and prescription medicines only as told by your health care provider. If you  were prescribed an antibiotic medicine, take it as told by your health care provider. Do not stop taking the antibiotic even if you start to feel better. Do not have sex until your health care provider says it is okay to do so. Keep all follow-up visits. This is important. Contact a health care provider if: You have a fever. Pain and swelling have not gotten better after 3 days. Get help right away if: Your pain is getting worse. The swelling in your testicle gets worse. Summary Orchitis is inflammation of a testicle. It is caused by an infection from bacteria or a virus. The most common symptoms of orchitis are swelling and pain in the scrotum. Treatment for this condition depends on the cause. It may include medicines to fight the infection, reduce inflammation, and relieve the pain. Follow your health care provider's instructions about resting, icing, not having sex, and taking medicines. This information is not intended to replace advice given to you by your health care provider. Make sure you discuss any questions you have with your health care provider. Document Revised: 05/14/2021 Document Reviewed: 05/14/2021 Elsevier Patient Education  2023 [employer]  

## 2022-04-25 NOTE — Progress Notes (Signed)
04/25/22 1:18 PM   Tyler Montgomery 1960-11-22 [id number]  Referring provider:  Mickey Farber, MD [address] [place],  [place] [address] Chief Complaint  Patient presents with   Testicle Pain     HPI: Tyler Montgomery is a 61 y.o.male who presents today for further evaluation of testicular pain.    He reports that his symptoms initially started over Memorial Day weekend when he began having fevers, chills, malaise, dysuria, chest pain, fatigue.  Etc.  He was seen at [facility] urgent care in [place] at that time at which time his urine was frankly positive.  He ultimately grew E. coli.  He was started on 3 different antibiotics which was later paired down to just Bactrim which she continues now.  He had nausea vomiting was unable to tolerate triple antibiotics.  He was seen at [facility] on 04/22/2022. He was noted to have swelling the testicles accompanied by pain.    He underwent a scrotal ultrasound to further evaluate and visualized a a highly unusual appearance of bilateral varicoceles (potentially partially thrombosed on the right) with a high echogenicity cord-like structure extending longitudinally down the right epididymis, conceivably representing thrombosed pampiniform plexus or hematoma in the epididymis. There is also non mass like high echogenicity in the left  epididymal head surrounding an epididymal cyst  His testicular swelling has not improved.  His dysuria resolved.  He has been scrotal support evaluation Xarelto for A-fib.  No previous history of urinary tract infections.  No significant baseline urinary symptoms.   PMH: Past Medical History:  Diagnosis Date   A-fib (HCC)    Cardiomyopathy (HCC)    Diabetes (HCC)     Surgical History: Past Surgical History:  Procedure Laterality Date   EP IMPLANTABLE DEVICE     JOINT REPLACEMENT     NASAL SINUS SURGERY      Home Medications:  Allergies as of 04/25/2022       Reactions   Influenza Virus Vaccine  Other (See Comments)   Hospitalized for 5 days after   Perflutren Lipid Microspheres Other (See Comments)   Other reaction(s): Muscle Pain Legs gave out   Tomato Hives   Other    Flu vaccine, pt was in the hospital 5 days after getting vaccine        Medication List        Accurate as of April 25, 2022  1:18 PM. If you have any questions, ask your nurse or doctor.          STOP taking these medications    MAGnesium-Oxide 400 (240 Mg) MG tablet Generic drug: magnesium oxide   naproxen 500 MG tablet Commonly known as: Naprosyn   ondansetron 4 MG tablet Commonly known as: Zofran   ondansetron 8 MG disintegrating tablet Commonly known as: Zofran ODT   potassium chloride 20 MEQ packet Commonly known as: KLOR-CON   traMADol 50 MG tablet Commonly known as: ULTRAM   Viagra 50 MG tablet Generic drug: sildenafil       TAKE these medications    Acetaminophen 500 MG capsule Take by mouth.   [employer] D3 50 MCG (2000 UT) Caps Generic drug: Cholecalciferol Take 2,000 Units by mouth daily.   cyanocobalamin 1000 MCG tablet Take 1,000 mcg by mouth daily.   cyclobenzaprine 5 MG tablet Commonly known as: FLEXERIL Take 5 mg by mouth 3 (three) times daily as needed for muscle spasms.   furosemide 40 MG tablet Commonly known as: LASIX Take 2  tablets (80 mg total) by mouth daily. Take 2 tablets in the morning and 1 tablet in the evening.   gabapentin 300 MG capsule Commonly known as: NEURONTIN Take 300 mg by mouth 3 (three) times daily.   losartan 25 MG tablet Commonly known as: COZAAR Take 1 tablet by mouth daily. What changed: Another medication with the same name was removed. Continue taking this medication, and follow the directions you see here.   metoprolol succinate 25 MG 24 hr tablet Commonly known as: TOPROL-XL Take 1 tablet by mouth daily.   metoprolol succinate 100 MG 24 hr tablet Commonly known as: TOPROL-XL Take 100 mg by mouth daily.   metoprolol  tartrate 25 MG tablet Commonly known as: LOPRESSOR Take 25 mg by mouth daily.   omeprazole 40 MG capsule Commonly known as: PRILOSEC Take 1 capsule by mouth daily.   potassium chloride 10 MEQ tablet Commonly known as: KLOR-CON M Take 10 mEq by mouth daily.   pravastatin 20 MG tablet Commonly known as: PRAVACHOL Take 20 mg by mouth daily.   sulfamethoxazole-trimethoprim 800-160 MG tablet Commonly known as: BACTRIM DS SMARTSIG:1 Tablet(s) By Mouth Every 12 Hours   Xarelto 20 MG Tabs tablet Generic drug: rivaroxaban Take 1 tablet by mouth every evening.        Allergies:  Allergies  Allergen Reactions   Influenza Virus Vaccine Other (See Comments)    Hospitalized for 5 days after   Perflutren Lipid Microspheres Other (See Comments)    Other reaction(s): Muscle Pain Legs gave out    Tomato Hives   Other     Flu vaccine, pt was in the hospital 5 days after getting vaccine    Family History: Family History  Problem Relation Age of Onset   Diabetes Brother    Hypertension Brother     Social History:  reports that he has quit smoking. His smoking use included cigarettes. He has never used smokeless tobacco. He reports that he does not drink alcohol and does not use drugs.   Physical Exam: BP 120/74   Pulse 80   Ht 6\' 1"  (1.854 m)   Wt (!) 307 lb (139.3 kg)   BMI 40.50 kg/m   Constitutional:  Alert and oriented, No acute distress. HEENT: [place] AT, moist mucus membranes.  Trachea midline, no masses. Cardiovascular: No clubbing, cyanosis, or edema. Respiratory: Normal respiratory effort, no increased work of breathing. GU: Bilateral hydrocele approx. softball size mild tenderness left greater than right. His left testicle is enlarged and right testicle was minimally enlarged Skin: No rashes, bruises or suspicious lesions. Neurologic: Grossly intact, no focal deficits, moving all 4 extremities. Psychiatric: Normal mood and affect.     Laboratory Data: Lab  Results  Component Value Date   CREATININE 0.92 03/31/2017   Lab Results  Component Value Date   HGBA1C 6.2 (H) 03/30/2017    Pertinent Imaging: CLINICAL DATA:  Cellulitis and scrotal swelling   EXAM: SCROTAL ULTRASOUND   DOPPLER ULTRASOUND OF THE TESTICLES   TECHNIQUE: Complete ultrasound examination of the testicles, epididymis, and other scrotal structures was performed. Color and spectral Doppler ultrasound were also utilized to evaluate blood flow to the testicles.   COMPARISON:  None Available.   FINDINGS: Right testicle   Measurements: 4.9 by 2.5 by 3.5 cm. No mass or microlithiasis visualized.   Left testicle   Measurements: 4.8 by 3.2 by 2.8 cm. No mass or microlithiasis visualized.   Right epididymis: Abnormal, expanded with a hyperechoic tubular structure tracking through  the epididymis on 85 of series 1 -1, concerning for thrombosis in the pampiniform plexus or hematoma in the epididymis.   Left epididymis: Abnormal, with epididymal cysts versus spermatoceles measuring up to about 1.5 cm in diameter along with surrounding abnormal hyperechogenicity in the epididymal head.   Hydrocele:  Bilateral complex hydroceles with debris.   Varicocele: Bilateral. Possible thrombosed components of right varicocele.   Pulsed Doppler interrogation of both testes demonstrates normal low resistance arterial and venous waveforms bilaterally.   IMPRESSION: 1. There is no evidence of testicular infarct or acute orchitis. However, there is a highly unusual appearance of bilateral varicoceles (potentially partially thrombosed on the right) with a high echogenicity cord-like structure extending longitudinally down the right epididymis, conceivably representing thrombosed pampiniform plexus or hematoma in the epididymis. There is also non masslike high echogenicity in the left epididymal head surrounding an epididymal cyst, again very unusual appearance raising  the possibility of thrombosis in the left pampiniform plexus. 2. Bilateral complex hydroceles.   Electronically Signed: By: Gaylyn Rong M.D. On: 04/22/2022 14:25   I have personally reviewed the images and agree with radiologist interpretation.   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   Call midweek next week if not improving or sooner if worsens  Tawni Millers as a scribe for Vanna Scotland, MD.,have documented all relevant documentation on the behalf of Vanna Scotland, MD,as directed by  Vanna Scotland, MD while in the presence of Vanna Scotland, MD.  I have reviewed the above documentation for accuracy and completeness, and I agree with the above.   Vanna Scotland, MD   [facility] [address] [place], [place]   I spent 46 total minutes on the day of the encounter including pre-visit review of the medical record, face-to-face time with the patient, and post visit ordering of labs/imaging/tests.  Case was personally discussed with Dr. Sampson Goon.

## 2022-04-30 ENCOUNTER — Other Ambulatory Visit: Payer: Self-pay

## 2022-04-30 DIAGNOSIS — N453 Epididymo-orchitis: Secondary | ICD-10-CM

## 2022-04-30 NOTE — Addendum Note (Signed)
Addended by: Veneta Penton on: 04/30/2022 02:06 PM   Modules accepted: Orders

## 2022-04-30 NOTE — Progress Notes (Signed)
Pt called stating he is still having pain and swelling. Patient states the pain is not worse but it is not better. As per Dr. Erlene Quan she would like patient to get another scrotal ultrasound. Order placed. We will call patient with results. Pt aware.

## 2022-05-01 ENCOUNTER — Ambulatory Visit
Admission: RE | Admit: 2022-05-01 | Discharge: 2022-05-01 | Disposition: A | Discharge status: Home / Self Care | Payer: BC Managed Care – PPO | Source: Ambulatory Visit | Attending: Urology | Admitting: Urology

## 2022-05-01 DIAGNOSIS — N453 Epididymo-orchitis: Secondary | ICD-10-CM | POA: Diagnosis present

## 2022-05-05 ENCOUNTER — Telehealth: Payer: Self-pay

## 2022-05-05 NOTE — Telephone Encounter (Signed)
Pt aware and verbalized understanding.  

## 2022-05-05 NOTE — Telephone Encounter (Signed)
-----   Message from Vanna Scotland, MD sent at 05/05/2022  1:41 PM EDT ----- Let him know that this ultrasound is reassuring.  There is good blood flow in both of his testicles.  It looks like both epididymi are still inflamed.  There is no drainable fluid collection including no abscess.  Vanna Scotland, MD

## 2023-12-02 NOTE — ED Provider Notes (Signed)
 [facility] EMERGENCY DEPARTMENT  ED Provider Note History   Chief Complaint  Patient presents with  . Dizziness  . Bradycardia   History of Present Illness HPI  Patient presents to emergency department via campus response.  He was at an outpatient pulmonary medicine visit today, had episodes of lightheadedness with heart rate noted to be 30s to 60s.  There was concern for slow atrial fibrillation.  Patient does have a pacemaker/AICD in place.  He denies new chest pain, shortness of breath, palpitations, syncope.  States that when heart rate was measured and found to be low it was with a finger-based monitor, not on a twelve-lead EKG.  Patient otherwise states that he has been at his baseline state of health, states that he does feel like he struggled with his CPAP last night.  Additional past medical history as listed below.  Past Medical History:  Diagnosis Date  . Anxiety October  2018  . Arrhythmia 02/2012  . B12 deficiency 02/22/2017   B12 level 224 on 12/09/2016; started on daily oral B12  . Cardiomyopathy (CMS/HHS-HCC) 08/18/2014  . CHF (congestive heart failure) (CMS/HHS-HCC)   . Chronic renal insufficiency, stage 3 (moderate) (CMS/HHS-HCC)   . 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 Depression 08/2017   (mild) possible TBI  . Dilated cardiomyopathy (CMS/HHS-HCC) 08/18/2014  . Elevated PSA, less than 10 ng/ml 02/27/2020   PSA 4.80  . Erectile dysfunction 08/18/2014  . Heart disease 02/2012   Heart LVEF 20%  . Hypercholesterolemia   . Hypertension associated with diabetes  (CMS/HHS-HCC) 04/07/2017  . Hypogonadism in male 08/18/2014  . 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 Migraine headache 07/2000   CT scan last performed  . Multiple nodules of lung 01/05/2017  . Obesity   . 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 Osteopenia of multiple sites 05/31/2018   T-scores from -2.1 to -0.4 on 03/02/2018 BMD  .  Paroxysmal atrial fibrillation (CMS/HHS-HCC) 07/23/2012   Loaded on dofetilide 07/2012   . Post-traumatic osteoarthritis of both knees 02/06/2015  . Pulmonary hypertension (CMS/HHS-HCC) 07/23/2012   PH Data: 1. CV MRI 08/10/12: Moderate LV enlargement with severe global systolic dysfunction (LVEF 25%). 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 2. TTE 03/08/15: Poor quality study. LV normal in size with low normal systolic function with estimated ejection fraction of 50%. RV is poorly visualized, normal in size and systolic function. IVC suboptimally visualized. N  . Severe obstructive sleep apnea 04/06/2017   on CPAP  . Tremor 07/2018  . Ulnar neuropathy at elbow, left 08/23/2015   On 03/21/2015 NCV's  . Ventricular tachycardia (CMS/HHS-HCC)    Past Surgical History:  Procedure Laterality Date  . SINUS SURGERY  2003   Sinus wash  . KNEE ARTHROSCOPY Right 02/29/2004  . KNEE ARTHROSCOPY Right 04/01/2011  . CARDIOVERSION EXTERNAL  2013  . INSERT / REPLACE / REMOVE PACEMAKER  2014   aicd  . cardiac ablation    . CARDIAC CATHETERIZATION    . CARDIAC SURGERY     heart catherization, no blockages  . [facility] SINUS Bilateral    Family History  Problem Relation Age of Onset  . Diabetes type II Father   . High blood pressure (Hypertension) Father   . Brain hemorrhage Father   . Brain cancer Father        Tumor on brain stem  . Alzheimer's disease Mother   . Uterine cancer Mother   . Coronary Artery Disease (Blocked  arteries around heart) Neg Hx   . Myocardial Infarction (Heart attack) Neg Hx   . Sudden death (unexpected death due to unknown cause) Neg Hx   . Anemia Neg Hx   . Irregular Heart Beat (Arrhythmia) Neg Hx   . Asthma Neg Hx   . Atrial fibrillation (Abnormal heart rhythm sometimes requiring treatment with blood thinners) Neg Hx   . Cardiomyopathy (Abnormal function of the heart muscle) Neg Hx   . Fainting (Syncope-sudden loss of consciousness) Neg Hx    . Heart disease Neg Hx   . Heart murmur Neg Hx   . Hypercoagulable state (Tendency to form frequent blood clots) Neg Hx   . Hyperlipidemia (Elevated cholesterol) Neg Hx   . Intolerance to statin medications Neg Hx   . Long QT syndrome (Specific condition related to abnormal electrical conduction in the heart) Neg Hx   . Peripheral Vascular Disease (PVD or blocked arteries in arms and legs) Neg Hx   . Anesthesia problems Neg Hx    Social History   Socioeconomic History  . Marital status: Married  Tobacco Use  . Smoking status: Former    Current packs/day: 0.00    Average packs/day: 1 pack/day for 8.2 years (8.2 ttl pk-yrs)    Types: Cigarettes    Start date: 11/17/1977    Quit date: 01/21/1986    Years since quitting: 37.8  . Smokeless tobacco: Former    Types: Chew    Quit date: 01/21/1986  . Tobacco comments:    Non-Smoker for over 33 years  Vaping Use  . Vaping status: Never Used  Substance and Sexual Activity  . Alcohol use: Not Currently    Comment: social  . Drug use: No  . 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 Difficulty of Paying Living Expenses: Not hard at all  Food Insecurity: No Food Insecurity (06/17/2023)   Hunger Vital Sign   . Worried About Programme Researcher, Broadcasting/film/video in the Last Year: Never true   . Ran Out of Food in the Last Year: Never true  Transportation Needs: No Transportation Needs (06/17/2023)   PRAPARE - Transportation   . Lack of Transportation (Medical): No   . Lack of Transportation (Non-Medical): No  Housing Stability: Unknown (12/01/2023)   Housing Stability Vital Sign   . Unable to Pay for Housing in the Last Year: No   . Homeless in the Last Year: No    Review of Systems  All other systems reviewed and are negative.  Physical Exam  BP 126/89   Pulse 74   Temp 37 C (98.6 F) (Oral)   Resp 13   SpO2 97%   Physical Exam  Gen: awake, alert, no distress, standing in room exam A, pleasant EENT:  normocephalic, atraumatic CV: regular S1 S2 Resp: CTAB GI: abdomen soft, non-distended, non-tender to palpation grossly Back: no deformity, non-tender to palpation midline MSK: extremities grossly normal, no significant peripheral edema or cyanosis Skin: warm, dry, no rashes or bruising to exposed areas Neuro: CN II-XII grossly intact, no obvious focal motor or sensory deficit Psych: conversant, normal affect Procedures  Procedures   Medical Decision Making  MDM  Patient is  63 year old male presented to emergency department following episode of dizziness and concern for bradycardia.  He does have a history of atrial fibrillation and has an AICD/pacemaker in place.  He denies associated chest pain, shortness of breath, palpitations.  EKG was obtained in the emergency department, by my interpretation demonstrates sinus rhythm with occasional premature beats.  No ischemic changes.  We were able to interrogate his Medtronic device and per report, 2 episodes of less than 1 second VT were noted.  No additional arrhythmias.  In light of same finding on device interrogation, doubt significant arrhythmia.  It is possible but given the way his heart rate was being measured he had frequent premature beats which would lead to effective low pulse rate. He continues to have occasional PVCs during period of telemetry monitoring in emergency department, does not have additional significant abnormality.  Further laboratory evaluation is benign and upon reevaluation patient states that he is feeling at his baseline.  Will plan for discharge.         ED Clinical Impression  1. Dizziness   2. Premature supraventricular beat      ED Disposition   Discharge     Follow-up  [facility] Emergency Department [address] [place]  [id number] [phone]    Epifanio Alm Dover, MD [address] [place] [address] [phone]            Saunders Norleen Bruckner, DO 12/02/23 1511

## 2023-12-02 NOTE — Progress Notes (Signed)
 Patient Active Problem List  Diagnosis  . Pulmonary hypertension (CMS/HHS-HCC)  . ICD (implantable cardioverter-defibrillator) in place  . Controlled type 2 diabetes with neuropathy (CMS/HHS-HCC)  . Dilated cardiomyopathy (CMS/HHS-HCC)  . Hypogonadism in male  . Erectile dysfunction  . Meniscus tear  . Post-traumatic osteoarthritis of both knees  . High risk medication use  . Ulnar neuropathy at elbow, left  . Encounter for fitting or adjustment of automatic implantable cardioverter-defibrillator  . Lung nodules  . Snoring  . Severe obesity with body mass index (BMI) of 35.0 to 39.9 with comorbidity (CMS/HHS-HCC)  . B12 deficiency  . Vitamin D deficiency  . Severe obstructive sleep apnea  . Hypertension associated with type 2 diabetes mellitus  (CMS/HHS-HCC)  . Chronic pain of right ankle  . Acute pain of left knee  . Osteoarthritis of ankle  . Chronic left shoulder pain  . Hip pain, acute, left  . Bilateral carpal tunnel syndrome  . Pain of lumbosacral spine  . Osteopenia of multiple sites  . Degeneration of lumbar intervertebral disc  . Chronic bilateral low back pain  . Leg weakness, bilateral  . Diabetic neuropathy (CMS/HHS-HCC)  . Tremor  . Reactive depression  . Bilateral chronic knee pain  . Primary osteoarthritis involving multiple joints  . Hypercholesterolemia  . Chronic mucoid otitis media of right ear  . Chronic Eustachian tube dysfunction, right  . Paroxysmal A-fib (CMS/HHS-HCC)  . Elevated PSA, less than 10 ng/ml  . Acquired thrombophilia (CMS/HHS-HCC)  . Mixed conductive and sensorineural hearing loss of right ear with restricted hearing of left ear  . Essential tremor  . Chronic intractable headache    CC: Tyler Montgomery is a 63 y.o. male that presents to clinic for follow up for Severe obstructive sleep apnea   SUBJECTIVE:  Tyler Montgomery is a pleasant 63 y.o. male here today for a follow up visit.  He has a h/o sleep apnea originally  diagnosed 2013.  He was initially intolerant of CPAP due to difficulty with face mask.  He has a history of paroxysmal AFib, PAH, dilated cardiomyopathy.  He has an ICD.  He was referred for repeat sleep study which he had 02/22/17.  This showed overall severe OSA.  He was having respiratory events on CPAP @ 15cwp.  It was recommended that he return to the sleep lab for BiPAP titration.  During titration study he had improved sleep quality on CPAP @ 9cwp. Seen 12/2017 at which time AHI was noted to be >10/h on CPAP @ 9cwp.  CPAP pressure adjusted to auto 8-15cwp.   His CPAP stopped working 02/2021. It could not be repaired and a new CPAP was ordered. He did not receive his new CPAP until late 02/2021. He went several weeks without use. He is now back on APAP 8-15cwp. Wearing nightly and tolerating well. Sleeping well with use.   He was seen in July 2023 for increased cough for several weeks. He was seen in the ED in May and was diagnosed with clinical pneumonia. CXR clear. He was treated with antibiotics which may have helped. FENO elevated. He was started on ICS/LABA which he took x1 month. This helped to resolve his cough. He currently has no respiratory complaints. He is not needing use of inhaled therapy at this time.   Interval History: Doing fair. He has been feeling weak and dizzy over the past several days. He has noticed that he has been going into Afib. He says his pacemaker is set  at 50 but has not been kicking. He is behind on his knee injections as his provider has been out of office. He has been in severe knee pain lately and questions whether this has been triggering his AFib.   Smoking Hx:   reports that he quit smoking about 37 years ago. His smoking use included cigarettes. He started smoking about 46 years ago. He has a 8.2 pack-year smoking history. He quit smokeless tobacco use about 37 years ago.  His smokeless tobacco use included chew. He reports that he does not currently use alcohol.  He reports that he does not use drugs.  Current Medications (including changes made at this visit) Current Outpatient Medications  Medication Sig Dispense Refill  . acetaminophen  (TYLENOL ) 500 mg capsule Take 1,000 mg by mouth every 4 (four) hours as needed (headaches)    . albuterol  MDI, PROVENTIL , VENTOLIN , PROAIR , HFA 90 mcg/actuation inhaler Inhale 2 inhalations into the lungs every 4 (four) hours as needed for Wheezing 1 each 6  . capsaicin (ZOSTRIX) 0.025 % cream Apply topically 2 (two) times daily as needed.    . cholecalciferol (VITAMIN D3) 2,000 unit tablet Take 4,000 Units by mouth once daily      . Compound Medication Med Name : Corticosteroids ( Kenalog-40) injection in left and right knee once every 3 months from last injection.    . cyanocobalamin (VITAMIN B12) 1000 MCG tablet Take 1,000 mcg by mouth once daily    . cyclobenzaprine (FLEXERIL) 5 MG tablet Take 5 mg by mouth 3 (three) times daily as needed    . fluticasone propionate (FLONASE) 50 mcg/actuation nasal spray Place 2 sprays into both nostrils once daily    . FUROsemide  (LASIX ) 40 MG tablet Take 2 tablets (80 mg total) by mouth once daily 180 tablet 3  . gabapentin (NEURONTIN) 400 MG capsule Take 1 capsule (400 mg total) by mouth 3 (three) times daily 90 capsule 11  . levalbuterol (XOPENEX HFA) inhaler Inhale 2 inhalations into the lungs every 4 (four) hours as needed for Wheezing    . losartan (COZAAR) 25 MG tablet Take 1 tablet (25 mg total) by mouth once daily 90 tablet 3  . magnesium oxide (MAG-OX) 400 mg (241.3 mg magnesium) tablet TAKE 1 TABLET(400 MG) BY MOUTH EVERY DAY 90 tablet 3  . metoprolol  succinate (TOPROL -XL) 100 MG XL tablet Take 1 tablet (100 mg total) by mouth once daily 30 tablet 11  . metoprolol  TARTrate (LOPRESSOR ) 25 MG tablet Take 1 tablet (25 mg total) by mouth once daily as directed 90 tablet 3  . potassium chloride  (KLOR-CON  M10) 10 mEq ER tablet TAKE 1 TABLET(10 MEQ) BY MOUTH EVERY DAY 30 tablet  11  . pravastatin (PRAVACHOL) 20 MG tablet TAKE 1 TABLET(20 MG) BY MOUTH EVERY DAY FOR CHOLESTEROL 90 tablet 3  . topiramate (TOPAMAX) 50 MG tablet Take 1 tablet (50 mg total) by mouth at bedtime 30 tablet 11  . traMADoL (ULTRAM) 50 mg tablet Take 1 tablet (50 mg total) by mouth every 6 (six) hours as needed for Pain 120 tablet 2  . triamcinolone acetonide (KENALOG-40 INJ) Inject as directed every 3 (three) months Patient has injection in both knees once every 3 months from last injection.    SABRA UNABLE TO FIND Med Name: CPAP    . XARELTO  20 mg tablet TAKE 1 TABLET(20 MG) BY MOUTH AT BEDTIME 90 tablet 1   No current facility-administered medications for this visit.    OBJECTIVE: Blood pressure 119/61,  pulse (!) 44, resp. rate 16, height 185.4 cm (6' 0.99), weight (!) 135.6 kg (299 lb), SpO2 99%. Body mass index is 39.46 kg/m.  General:  NAD. Able to speak in complete sentences without cough or dyspnea HEENT: Normocephalic, PERRL, Oropharynx with moist mucosa, no erythema, exudates CV: RRR no murmurs, gallops, rubs Pulm: Normal respiratory effort,  Clear to auscultation bilaterally without wheezing or crackles Extremities: No edema  IMAGING & DIAGNOSTICS     Latest Ref Rng & Units 09/08/2016   10:17 AM 05/15/2022    3:44 PM 11/28/2022   11:46 AM  Pulmonary Function Test (PFT)  FVC Pre L 5.27  4.24  5.02   FVC_%PRED % 91  85 %  98 %   FVC_Z-SCORE   -1.04  -0.15   FEV1 Pre L 4.05  3.05  3.77   FEV1_%PRED % 90  79 %  96 %   FEV1/FVC Pre % 76.77  71.94  75.19   FEV1_Z-SCORE   -1.39  -0.28   FEF25-75% Pre L/s 3.44  2.24  3.04   FEF25-75%_%PRED % 85  71 %  95 %   FEF25-75%_Z-SCORE   -0.87  -0.13   TLC Pre L 8.84     TLC_%PRED % 108     RV Pre L 3.09     RV_%PRED % 128     DLCO Pre ml/(min*mmHg) 32     DLCOSINGLEBREATH_%PRED % 108         ASSESSMENT: Pulmonary hypertension (CMS/HHS-HCC)  (primary encounter diagnosis)  Severe obstructive sleep apnea  Reviewed patient's  BiPAP compliance and therapeutic report through [facility].  Compliant with and tolerant of BiPAP. Benefiting from use.  Continuation warranted.  He has known AFib.HR currently going from 30s to 60s which is atypical for him. He has a pacemaker that is set at 50 but does not seem to be kicking in. He is feeling weak and dizzy. Transport service called and will escort him to the ED for further workup.     PLAN:  Continue BiPAP nightly Ok to stay off Breo 200 for now Continue use of albuterol  rescue inhaler 2 puffs every 4-6 hours as needed for wheezing, shortness of breath, cough. 6 month follow up   I personally performed this service.   Rosina Fortune [place], [place]  Portions of this note have been created with the use of voice recognition software.  Please excuse typos that have been missed during the editing process.     Attestation Statement:   I personally performed the service, non-incident to. [facility])   Rosina Fortune [place], [place]

## 2024-03-03 ENCOUNTER — Encounter: Payer: Self-pay | Admitting: *Deleted

## 2024-03-07 ENCOUNTER — Encounter
Admission: RE | Disposition: A | Discharge status: Home / Self Care | Payer: Self-pay | Source: Home / Self Care | Attending: Gastroenterology

## 2024-03-07 ENCOUNTER — Ambulatory Visit
Admission: RE | Admit: 2024-03-07 | Discharge: 2024-03-07 | Disposition: A | Discharge status: Home / Self Care | Payer: BC Managed Care – PPO | Attending: Gastroenterology | Admitting: Gastroenterology

## 2024-03-07 ENCOUNTER — Ambulatory Visit: Admitting: Anesthesiology

## 2024-03-07 ENCOUNTER — Other Ambulatory Visit: Payer: Self-pay

## 2024-03-07 DIAGNOSIS — K625 Hemorrhage of anus and rectum: Secondary | ICD-10-CM | POA: Insufficient documentation

## 2024-03-07 DIAGNOSIS — Z95810 Presence of automatic (implantable) cardiac defibrillator: Secondary | ICD-10-CM | POA: Diagnosis not present

## 2024-03-07 DIAGNOSIS — K640 First degree hemorrhoids: Secondary | ICD-10-CM | POA: Insufficient documentation

## 2024-03-07 DIAGNOSIS — I4891 Unspecified atrial fibrillation: Secondary | ICD-10-CM | POA: Insufficient documentation

## 2024-03-07 DIAGNOSIS — D123 Benign neoplasm of transverse colon: Secondary | ICD-10-CM | POA: Diagnosis not present

## 2024-03-07 DIAGNOSIS — I509 Heart failure, unspecified: Secondary | ICD-10-CM | POA: Insufficient documentation

## 2024-03-07 DIAGNOSIS — Z7901 Long term (current) use of anticoagulants: Secondary | ICD-10-CM | POA: Diagnosis not present

## 2024-03-07 DIAGNOSIS — E119 Type 2 diabetes mellitus without complications: Secondary | ICD-10-CM | POA: Diagnosis not present

## 2024-03-07 DIAGNOSIS — Z87891 Personal history of nicotine dependence: Secondary | ICD-10-CM | POA: Insufficient documentation

## 2024-03-07 DIAGNOSIS — K635 Polyp of colon: Secondary | ICD-10-CM | POA: Diagnosis not present

## 2024-03-07 DIAGNOSIS — K219 Gastro-esophageal reflux disease without esophagitis: Secondary | ICD-10-CM | POA: Diagnosis not present

## 2024-03-07 HISTORY — DX: Prediabetes: R73.03

## 2024-03-07 HISTORY — PX: POLYPECTOMY: SHX149

## 2024-03-07 HISTORY — PX: COLONOSCOPY WITH PROPOFOL: SHX5780

## 2024-03-07 HISTORY — DX: Cardiac arrhythmia, unspecified: I49.9

## 2024-03-07 SURGERY — COLONOSCOPY WITH PROPOFOL
Anesthesia: General

## 2024-03-07 MED ORDER — SODIUM CHLORIDE 0.9 % IV SOLN: INTRAVENOUS | Status: DC | PRN

## 2024-03-07 MED ORDER — PROPOFOL 500 MG/50ML IV EMUL
INTRAVENOUS | Status: DC | PRN
  Administered 2024-03-07: 200 ug/kg/min via INTRAVENOUS
  Administered 2024-03-07 (×3): 50 mg via INTRAVENOUS

## 2024-03-07 MED ORDER — SODIUM CHLORIDE 0.9 % IV SOLN: INTRAVENOUS | Status: DC

## 2024-03-07 MED ORDER — PHENYLEPHRINE 80 MCG/ML (10ML) SYRINGE FOR IV PUSH (FOR BLOOD PRESSURE SUPPORT)
PREFILLED_SYRINGE | INTRAVENOUS | Status: DC | PRN
  Administered 2024-03-07 (×2): 80 ug via INTRAVENOUS

## 2024-03-07 NOTE — H&P (Signed)
 Outpatient short stay form Pre-procedure 03/07/2024  Shane Darling, MD  Primary Physician: Eartha Gold, MD  Reason for visit:  Rectal Bleeding  History of present illness:    63 y/o gentleman with history of HFrEF, a. Fib on xarelto  (last dose 4 days ago), and obesity here for colonoscopy due to history of rectal bleeding. He has never had a colonoscopy. No significant abdominal surgeries. No family history of GI malignancies.    Current Facility-Administered Medications:    0.9 %  sodium chloride  infusion, , Intravenous, Continuous, Graden Hoshino, Leanora Prophet, MD, Last Rate: 20 mL/hr at 03/07/24 0926, New Bag at 03/07/24 0926  Medications Prior to Admission  Medication Sig Dispense Refill Last Dose/Taking   Acetaminophen  500 MG capsule Take by mouth.   Past Week   Cholecalciferol ([employer] D3) 2000 units CAPS Take 2,000 Units by mouth daily.   Past Week   cyanocobalamin 1000 MCG tablet Take 1,000 mcg by mouth daily.   Past Week   cyclobenzaprine (FLEXERIL) 5 MG tablet Take 5 mg by mouth 3 (three) times daily as needed for muscle spasms.   03/06/2024   furosemide  (LASIX ) 40 MG tablet Take 2 tablets (80 mg total) by mouth daily. Take 2 tablets in the morning and 1 tablet in the evening. 30 tablet 0 03/06/2024   gabapentin (NEURONTIN) 300 MG capsule Take 300 mg by mouth 3 (three) times daily.   03/07/2024 at  6:00 AM   losartan (COZAAR) 25 MG tablet Take 1 tablet by mouth daily.   03/06/2024   metoprolol  succinate (TOPROL -XL) 100 MG 24 hr tablet Take 100 mg by mouth daily.   03/06/2024   metoprolol  succinate (TOPROL -XL) 25 MG 24 hr tablet Take 1 tablet by mouth daily.  3 03/06/2024   metoprolol  tartrate (LOPRESSOR ) 25 MG tablet Take 25 mg by mouth daily.   03/06/2024   omeprazole (PRILOSEC) 40 MG capsule Take 1 capsule by mouth daily.  4 03/06/2024   potassium chloride  (KLOR-CON  M) 10 MEQ tablet Take 10 mEq by mouth daily.   03/06/2024   pravastatin (PRAVACHOL) 20 MG tablet Take 20 mg by mouth  daily.   03/07/2024 at  6:00 AM   sulfamethoxazole-trimethoprim (BACTRIM DS) 800-160 MG tablet SMARTSIG:1 Tablet(s) By Mouth Every 12 Hours   03/06/2024   traMADol (ULTRAM) 50 MG tablet Take by mouth every 6 (six) hours as needed.   Taking As Needed   XARELTO  20 MG TABS tablet Take 1 tablet by mouth every evening.  6 03/06/2024     Allergies  Allergen Reactions   Influenza Virus Vaccine Other (See Comments)    Hospitalized for 5 days after   Perflutren Lipid Microspheres Other (See Comments)    Other reaction(s): Muscle Pain Legs gave out    Tomato Hives   Other     Flu vaccine, pt was in the hospital 5 days after getting vaccine     Past Medical History:  Diagnosis Date   A-fib (HCC)    Cardiomyopathy (HCC)    Dysrhythmia    Pre-diabetes     Review of systems:  Otherwise negative.    Physical Exam  Gen: Alert, oriented. Appears stated age.  HEENT: PERRLA. Lungs: No respiratory distress CV: RRR Abd: soft, benign, no masses Ext: No edema    Planned procedures: Proceed with colonoscopy. The patient understands the nature of the planned procedure, indications, risks, alternatives and potential complications including but not limited to bleeding, infection, perforation, damage to internal organs and possible oversedation/side  effects from anesthesia. The patient agrees and gives consent to proceed.  Please refer to procedure notes for findings, recommendations and patient disposition/instructions.     Shane Darling, MD [facility]

## 2024-03-07 NOTE — Anesthesia Preprocedure Evaluation (Addendum)
 Anesthesia Evaluation  Patient identified by MRN, date of birth, ID band Patient awake    Reviewed: Allergy & Precautions, H&P , NPO status , Patient's Chart, lab work & pertinent test results, reviewed documented beta blocker date and time   Airway Mallampati: II  TM Distance: >3 FB Neck ROM: full    Dental no notable dental hx.    Pulmonary former smoker   Pulmonary exam normal        Cardiovascular pulmonary hypertension+CHF  + dysrhythmias (pAFstatus post catheter ablation) Atrial Fibrillation + Cardiac Defibrillator (status post single-chamber ICD, currently clinically)   ECHO 2022: INTERPRETATION  LOW-NORMAL LEFT VENTRICULAR SYSTOLIC FUNCTION WITH AN ESTIMATED EF = 45-50 %  NORMAL RIGHT VENTRICULAR SYSTOLIC FUNCTION  TRIVIAL REGURGITATION NOTED (See above)  NO VALVULAR STENOSIS  PACER WIRE NOTED  Mitral: TRIVIAL MR  Closest EF: >55% (Estimated)   ECG 04/15/2022 revealed normal sinus rhythm at 91 bpm. 2D echocardiogram 05/05/2022 revealed LVEF 50%. ICD interrogation 12/02/2023 revealed predominant ventricular sensing, no VT, with longevity of 20 months.    Neuro/Psych negative neurological ROS  negative psych ROS   GI/Hepatic Neg liver ROS,GERD  ,,  Endo/Other  diabetes, Type 2    Renal/GU negative Renal ROS  negative genitourinary   Musculoskeletal   Abdominal  (+) + obese  Peds  Hematology negative hematology ROS (+)   Anesthesia Other Findings Patient unable to take his metoprolol  this morning. Will give preop.   Per cardiology note;  Patient saw his pulmonologist in [facility] 12/02/2023 at which time he was noted to be bradycardic, sent to [facility] ED where he was noted to have PVCs. He has chronic peripheral edema, better in the morning  Past Medical History: No date: A-fib (HCC) No date: Cardiomyopathy (HCC) No date: Diabetes (HCC)  Past Surgical History: No date: EP IMPLANTABLE DEVICE No date:  JOINT REPLACEMENT No date: NASAL SINUS SURGERY     Reproductive/Obstetrics negative OB ROS                             Anesthesia Physical Anesthesia Plan  ASA: 3  Anesthesia Plan: General   Post-op Pain Management: Minimal or no pain anticipated   Induction: Intravenous  PONV Risk Score and Plan: 3 and Propofol  infusion and TIVA  Airway Management Planned: Natural Airway  Additional Equipment: None  Intra-op Plan:   Post-operative Plan:   Informed Consent: I have reviewed the patients History and Physical, chart, labs and discussed the procedure including the risks, benefits and alternatives for the proposed anesthesia with the patient or authorized representative who has indicated his/her understanding and acceptance.     Dental Advisory Given  Plan Discussed with: CRNA and Surgeon  Anesthesia Plan Comments: (Discussed risks of anesthesia with patient, including possibility of difficulty with spontaneous ventilation under anesthesia necessitating airway intervention, PONV, and rare risks such as cardiac or respiratory or neurological events, and allergic reactions. Discussed the role of CRNA in patient's perioperative care. Patient understands.)        Anesthesia Quick Evaluation

## 2024-03-07 NOTE — Anesthesia Postprocedure Evaluation (Signed)
 Anesthesia Post Note  Patient: Tyler Montgomery  Procedure(s) Performed: COLONOSCOPY WITH PROPOFOL  POLYPECTOMY, INTESTINE  Patient location during evaluation: Endoscopy Anesthesia Type: General Level of consciousness: awake and alert Pain management: pain level controlled Vital Signs Assessment: post-procedure vital signs reviewed and stable Respiratory status: spontaneous breathing, nonlabored ventilation, respiratory function stable and patient connected to nasal cannula oxygen Cardiovascular status: blood pressure returned to baseline and stable Postop Assessment: no apparent nausea or vomiting Anesthetic complications: no  There were no known notable events for this encounter.   Last Vitals:  Vitals:   03/07/24 1017 03/07/24 1027  BP: 112/80 119/81  Pulse:    Resp:    Temp:    SpO2:      Last Pain:  Vitals:   03/07/24 1027  TempSrc:   PainSc: 0-No pain                 Enrique Harvest

## 2024-03-07 NOTE — Interval H&P Note (Signed)
 History and Physical Interval Note:  03/07/2024 9:38 AM  Tyler Montgomery  has presented today for surgery, with the diagnosis of Rectal bleeding,.  The various methods of treatment have been discussed with the patient and family. After consideration of risks, benefits and other options for treatment, the patient has consented to  Procedure(s): COLONOSCOPY WITH PROPOFOL  (N/A) as a surgical intervention.  The patient's history has been reviewed, patient examined, no change in status, stable for surgery.  I have reviewed the patient's chart and labs.  Questions were answered to the patient's satisfaction.     Shane Darling  Ok to proceed with colonoscopy

## 2024-03-07 NOTE — Op Note (Signed)
 [facility] Patient Name: Tyler Montgomery Procedure Date: 03/07/2024 9:13 AM MRN: [id number] Account #: [id number] Date of Birth: 1961-07-02 Admit Type: Outpatient Age: 63 Room: [facility] ENDO ROOM 3 Gender: Male Note Status: Finalized Instrument Name: Colonoscope [id number] Procedure:             Colonoscopy Indications:           Rectal bleeding Providers:             Leida Puna MD, MD Referring MD:          Lavelle Posey. Harwood Lingo (Referring MD) Medicines:             Monitored Anesthesia Care Complications:         No immediate complications. Estimated blood loss:                         Minimal. Procedure:             Pre-Anesthesia Assessment:                        - Prior to the procedure, a History and Physical was                         performed, and patient medications and allergies were                         reviewed. The patient is competent. The risks and                         benefits of the procedure and the sedation options and                         risks were discussed with the patient. All questions                         were answered and informed consent was obtained.                         Patient identification and proposed procedure were                         verified by the physician, the nurse, the                         anesthesiologist, the anesthetist and the technician                         in the endoscopy suite. Mental Status Examination:                         alert and oriented. Airway Examination: normal                         oropharyngeal airway and neck mobility. Respiratory                         Examination: clear to auscultation. CV Examination:  normal. Prophylactic Antibiotics: The patient does not                         require prophylactic antibiotics. Prior                         Anticoagulants: The patient has taken Xarelto                          (rivaroxaban ),  last dose was 4 days prior to                         procedure. ASA Grade Assessment: III - A patient with                         severe systemic disease. After reviewing the risks and                         benefits, the patient was deemed in satisfactory                         condition to undergo the procedure. The anesthesia                         plan was to use monitored anesthesia care (MAC).                         Immediately prior to administration of medications,                         the patient was re-assessed for adequacy to receive                         sedatives. The heart rate, respiratory rate, oxygen                         saturations, blood pressure, adequacy of pulmonary                         ventilation, and response to care were monitored                         throughout the procedure. The physical status of the                         patient was re-assessed after the procedure.                        After obtaining informed consent, the colonoscope was                         passed under direct vision. Throughout the procedure,                         the patient's blood pressure, pulse, and oxygen                         saturations were monitored continuously. The  Colonoscope was introduced through the anus and                         advanced to the the cecum, identified by appendiceal                         orifice and ileocecal valve. The colonoscopy was                         performed without difficulty. The patient tolerated                         the procedure well. The quality of the bowel                         preparation was adequate to identify polyps. The                         ileocecal valve, appendiceal orifice, and rectum were                         photographed. Findings:      The perianal and digital rectal examinations were normal.      A 2 mm polyp was found in the hepatic flexure. The polyp was  sessile.       The polyp was removed with a cold snare. Resection and retrieval were       complete. Estimated blood loss was minimal.      A 2 mm polyp was found in the transverse colon. The polyp was sessile.       The polyp was removed with a cold snare. Resection and retrieval were       complete. Estimated blood loss was minimal.      Internal hemorrhoids were found during retroflexion. The hemorrhoids       were Grade I (internal hemorrhoids that do not prolapse).      The exam was otherwise without abnormality on direct and retroflexion       views. Impression:            - One 2 mm polyp at the hepatic flexure, removed with                         a cold snare. Resected and retrieved.                        - One 2 mm polyp in the transverse colon, removed with                         a cold snare. Resected and retrieved.                        - Internal hemorrhoids.                        - The examination was otherwise normal on direct and                         retroflexion views. Recommendation:        - Discharge patient to home.                        -  Resume previous diet.                        - Resume Xarelto  (rivaroxaban ) at prior dose today.                        - Continue present medications.                        - Await pathology results.                        - Repeat colonoscopy in 7 years for surveillance.                        - Return to referring physician as previously                         scheduled. Procedure Code(s):     --- Professional ---                        [phone], Colonoscopy, flexible; with removal of                         tumor(s), polyp(s), or other lesion(s) by snare                         technique Diagnosis Code(s):     --- Professional ---                        D12.3, Benign neoplasm of transverse colon (hepatic                         flexure or splenic flexure)                        K64.0, First degree hemorrhoids                         K62.5, Hemorrhage of anus and rectum CPT copyright 2022 [employer]. All rights reserved. The codes documented in this report are preliminary and upon coder review may  be revised to meet current compliance requirements. Leida Puna MD, MD 03/07/2024 10:10:36 AM Number of Addenda: 0 Note Initiated On: 03/07/2024 9:13 AM Scope Withdrawal Time: 0 hours 12 minutes 44 seconds  Total Procedure Duration: 0 hours 16 minutes 45 seconds  Estimated Blood Loss:  Estimated blood loss was minimal.      [facility]

## 2024-03-07 NOTE — Transfer of Care (Signed)
 Immediate Anesthesia Transfer of Care Note  Patient: Tyler Montgomery  Procedure(s) Performed: COLONOSCOPY WITH PROPOFOL  POLYPECTOMY, INTESTINE  Patient Location: PACU and Endoscopy Unit  Anesthesia Type:General  Level of Consciousness: awake  Airway & Oxygen Therapy: Patient Spontanous Breathing  Post-op Assessment: Report given to RN and Post -op Vital signs reviewed and stable  Post vital signs: Reviewed and stable  Last Vitals:  Vitals Value Taken Time  BP 109/74 03/07/24 1012  Temp 36.1 C 03/07/24 1007  Pulse 58 03/07/24 1013  Resp 19 03/07/24 1013  SpO2 99 % 03/07/24 1013  Vitals shown include unfiled device data.  Last Pain:  Vitals:   03/07/24 1007  TempSrc: Temporal  PainSc: Asleep         Complications: There were no known notable events for this encounter.

## 2024-03-08 ENCOUNTER — Encounter: Payer: Self-pay | Admitting: Gastroenterology

## 2024-03-08 LAB — SURGICAL PATHOLOGY

## 2024-03-15 IMAGING — US US SCROTUM W/ DOPPLER COMPLETE
1 of 2 series · 12 of 25 positions shown · non-contrast
Comparison: None Available.
COMPARISON: None Available.

Addendum:
CLINICAL DATA: Cellulitis and scrotal swelling

EXAM:
SCROTAL ULTRASOUND
DOPPLER ULTRASOUND OF THE TESTICLES
TECHNIQUE: Complete ultrasound examination of the testicles, epididymis, and
other scrotal structures was performed. Color and spectral Doppler
ultrasound were also utilized to evaluate blood flow to the
testicles.

[Series 1: us scrotum w/doppler · 111 acquisitions, 12 frames shown]
[im 5/111]
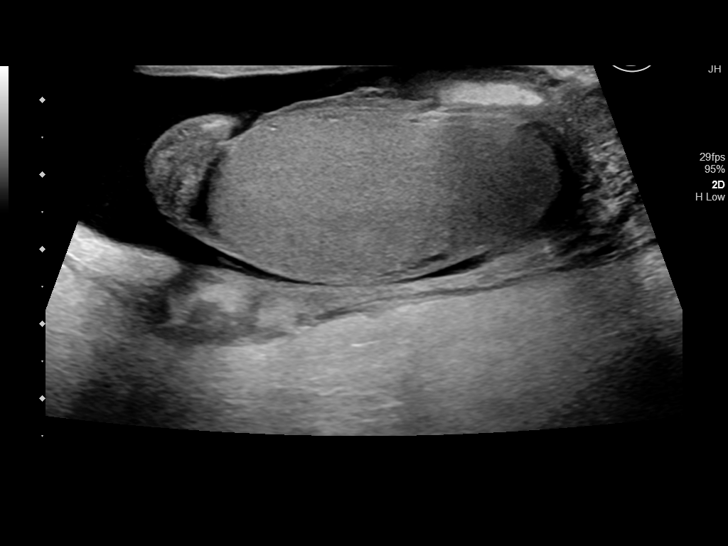
[im 15/111]
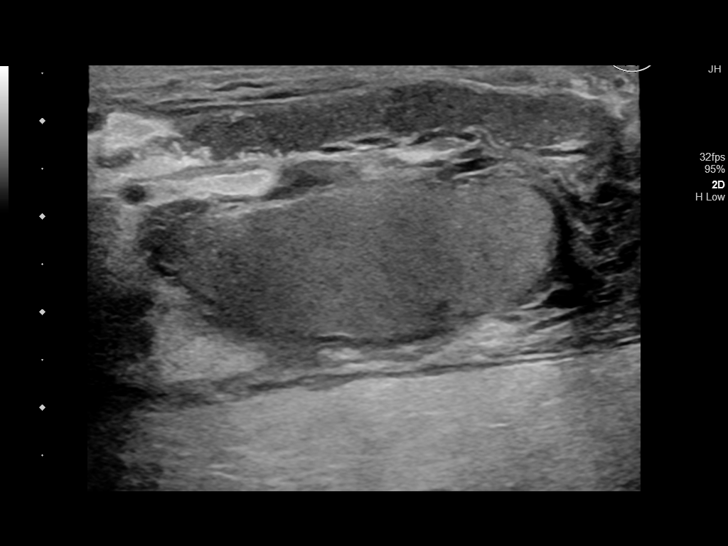
[im 24/111]
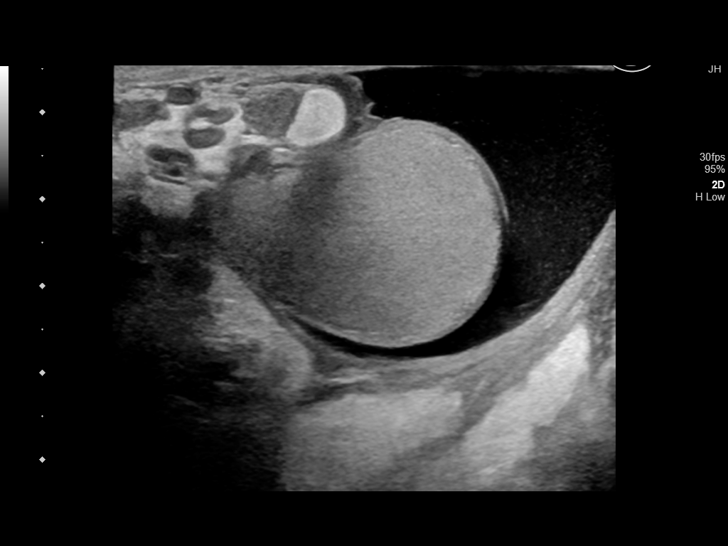
[im 34/111]
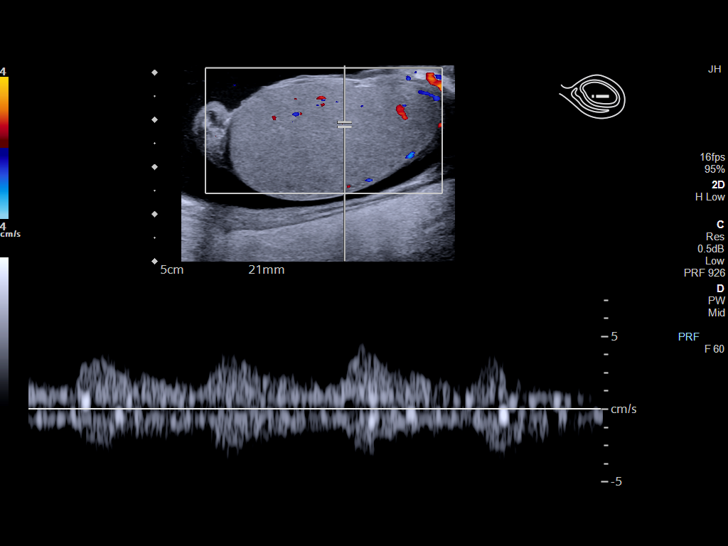
[im 44/111]
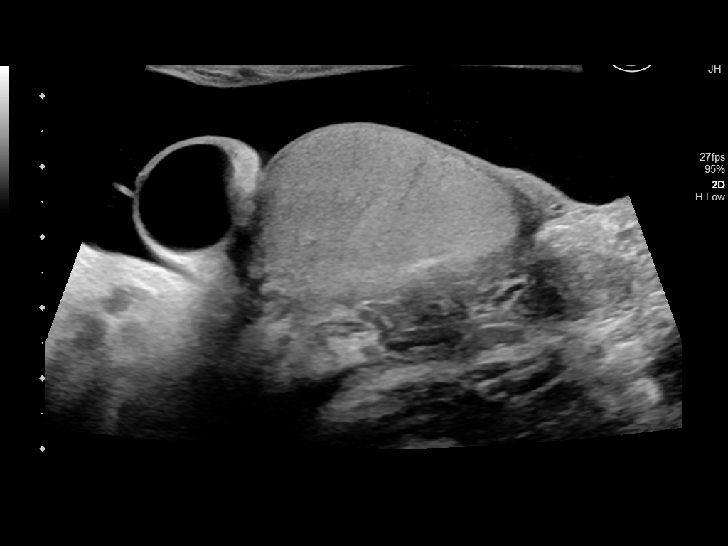
[im 53/111]
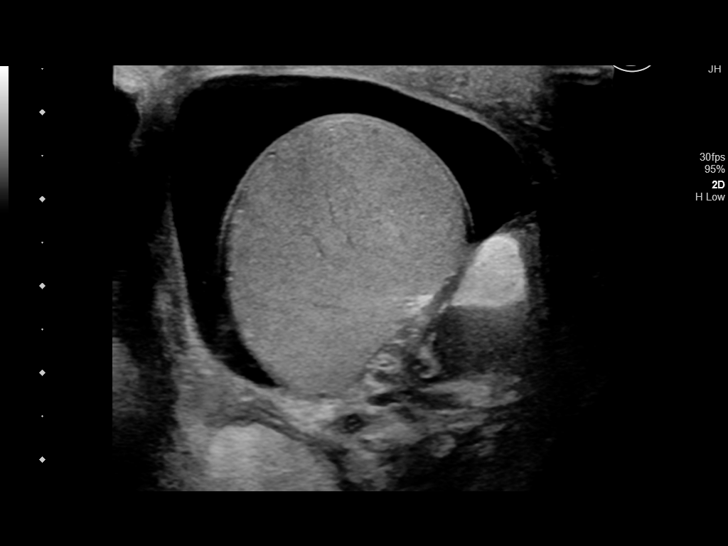
[im 63/111]
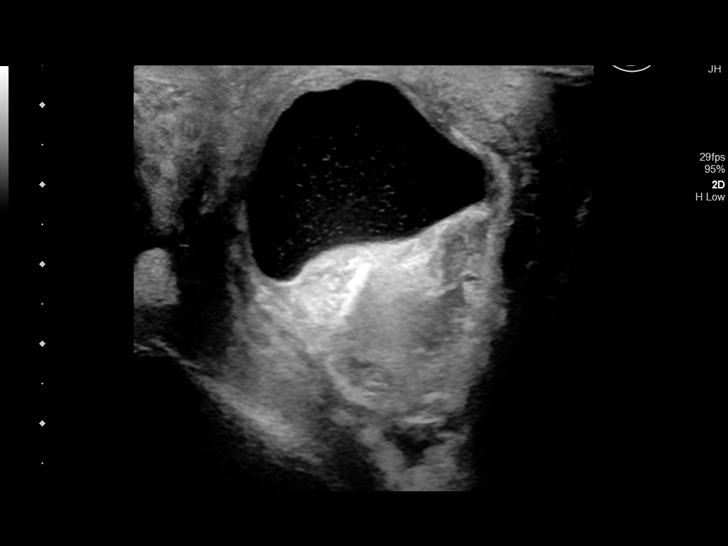
[im 72/111]
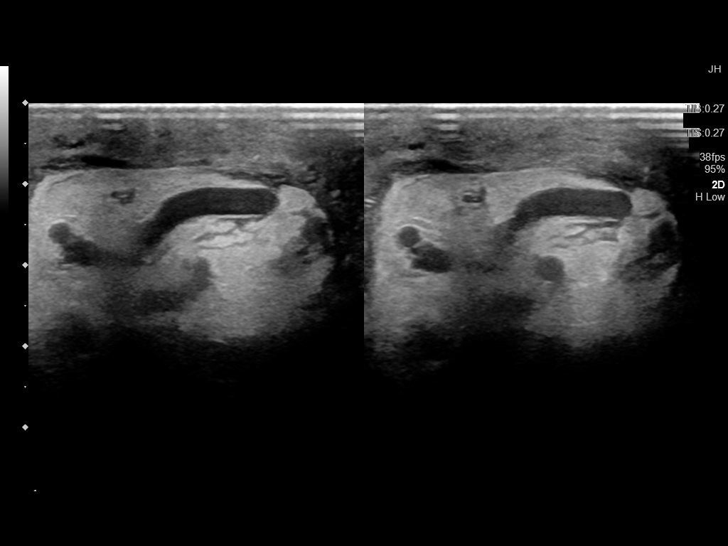
[im 82/111]
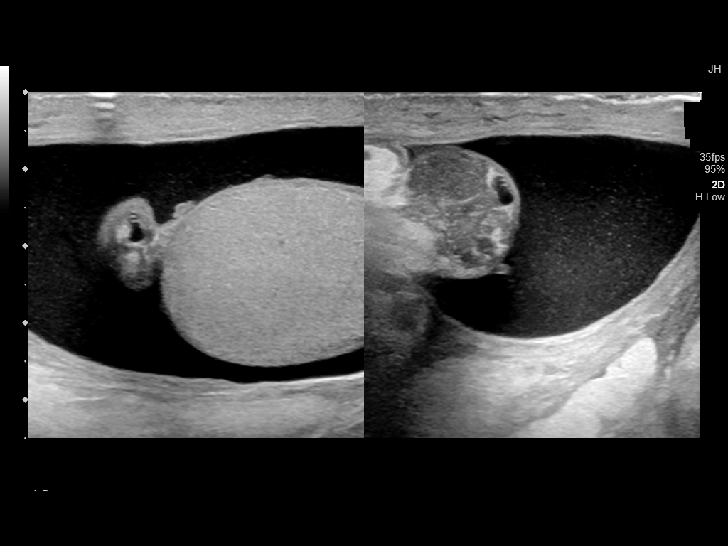
[im 91/111]
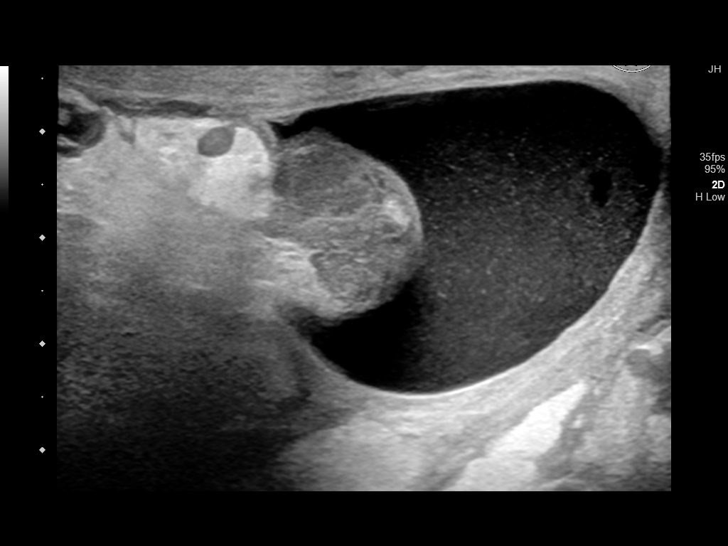
[im 101/111]
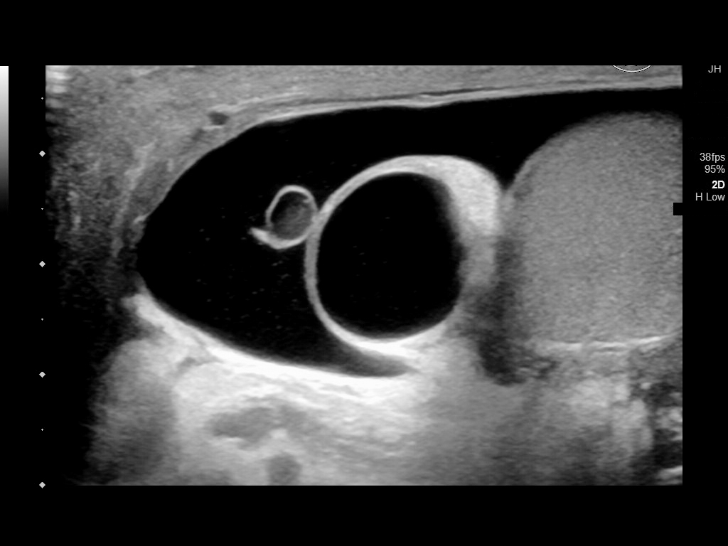
[im 111/111]
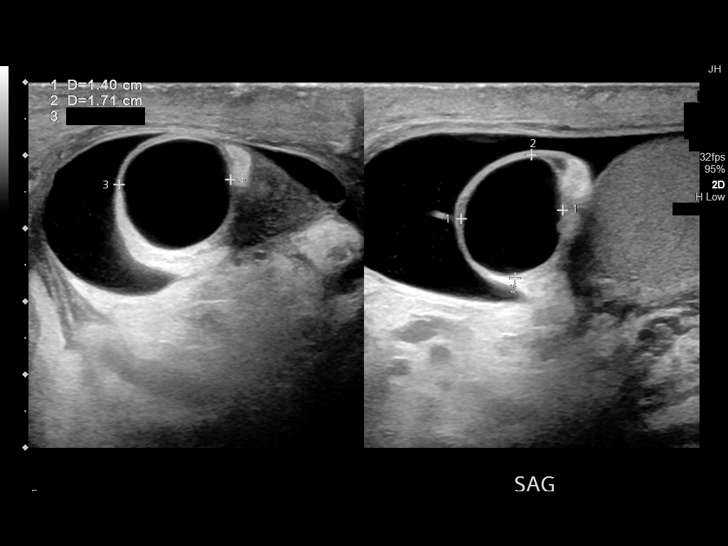

[12 of 25 positions shown; findings below may reference images not displayed]

FINDINGS: Right testicle

Measurements: 4.9 by 2.5 by 3.5 cm. No mass or microlithiasis
visualized.

Left testicle

Measurements: 4.8 by 3.2 by 2.8 cm. No mass or microlithiasis
visualized.

Right epididymis: Abnormal, expanded with a hyperechoic tubular
structure tracking through the epididymis on 85 of series [DATE],
concerning for thrombosis in the pampiniform plexus or hematoma in
the epididymis.

Left epididymis: Abnormal, with epididymal cysts versus
spermatoceles measuring up to about 1.5 cm in diameter along with
surrounding abnormal hyperechogenicity in the epididymal head.

Hydrocele:  Bilateral complex hydroceles with debris.

Varicocele: Bilateral. Possible thrombosed components of right
varicocele.

Pulsed Doppler interrogation of both testes demonstrates normal low
resistance arterial and venous waveforms bilaterally.
IMPRESSION: 1. There is no evidence of testicular infarct or acute orchitis.
However, there is a highly unusual appearance of bilateral
varicoceles (potentially partially thrombosed on the right) with a
high echogenicity cord-like structure extending longitudinally down
the right epididymis, conceivably representing thrombosed
pampiniform plexus or hematoma in the epididymis. There is also non
masslike high echogenicity in the left epididymal head surrounding
an epididymal cyst, again very unusual appearance raising the
possibility of thrombosis in the left pampiniform plexus.
2. Bilateral complex hydroceles.

ADDENDUM:
The original report was by Dr. Jeanet Brill. The following
addendum is by Dr. Jeanet Brill:

These results were called by telephone at the time of interpretation
on 04/22/2022 at [DATE] to provider CLAUDIIO JEEH , who verbally
acknowledged these results. I recommended urgent urology referral
or, if such referral is not available on an urgent basis, that the
patient proceed to the emergency department.

*** End of Addendum ***
FINDINGS: Right testicle

Measurements: 4.9 by 2.5 by 3.5 cm. No mass or microlithiasis
visualized.

Left testicle

Measurements: 4.8 by 3.2 by 2.8 cm. No mass or microlithiasis
visualized.

Right epididymis: Abnormal, expanded with a hyperechoic tubular
structure tracking through the epididymis on 85 of series [DATE],
concerning for thrombosis in the pampiniform plexus or hematoma in
the epididymis.

Left epididymis: Abnormal, with epididymal cysts versus
spermatoceles measuring up to about 1.5 cm in diameter along with
surrounding abnormal hyperechogenicity in the epididymal head.

Hydrocele:  Bilateral complex hydroceles with debris.

Varicocele: Bilateral. Possible thrombosed components of right
varicocele.

Pulsed Doppler interrogation of both testes demonstrates normal low
resistance arterial and venous waveforms bilaterally.
IMPRESSION: 1. There is no evidence of testicular infarct or acute orchitis.
However, there is a highly unusual appearance of bilateral
varicoceles (potentially partially thrombosed on the right) with a
high echogenicity cord-like structure extending longitudinally down
the right epididymis, conceivably representing thrombosed
pampiniform plexus or hematoma in the epididymis. There is also non
masslike high echogenicity in the left epididymal head surrounding
an epididymal cyst, again very unusual appearance raising the
possibility of thrombosis in the left pampiniform plexus.
2. Bilateral complex hydroceles.

## 2024-03-24 IMAGING — US US SCROTUM W/ DOPPLER COMPLETE
1 series · 13 of 25 positions shown · non-contrast
Comparison: Scrotal ultrasound 04/22/2022

CLINICAL DATA: Epididymal orchitis.

EXAM:
SCROTAL ULTRASOUND
DOPPLER ULTRASOUND OF THE TESTICLES
TECHNIQUE: Complete ultrasound examination of the testicles, epididymis, and
other scrotal structures was performed. Color and spectral Doppler
ultrasound were also utilized to evaluate blood flow to the
testicles.

[Series 1: us scrotum w/doppler · 13 of 105 slices shown]
[im 1/105]
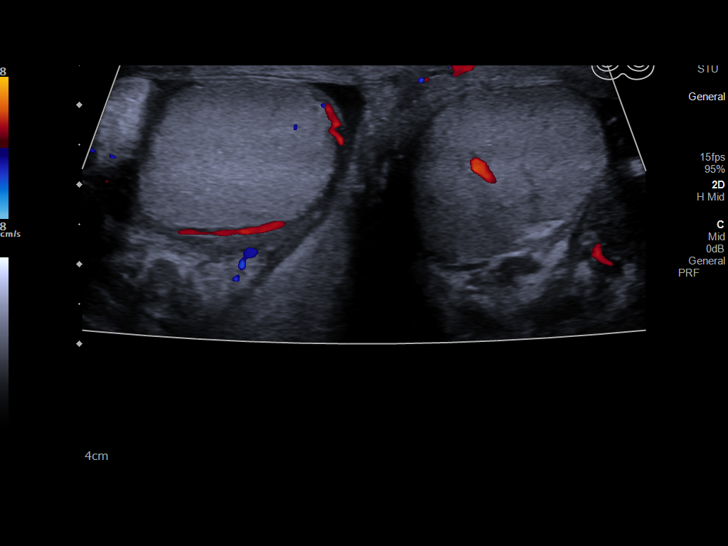
[im 9/105]
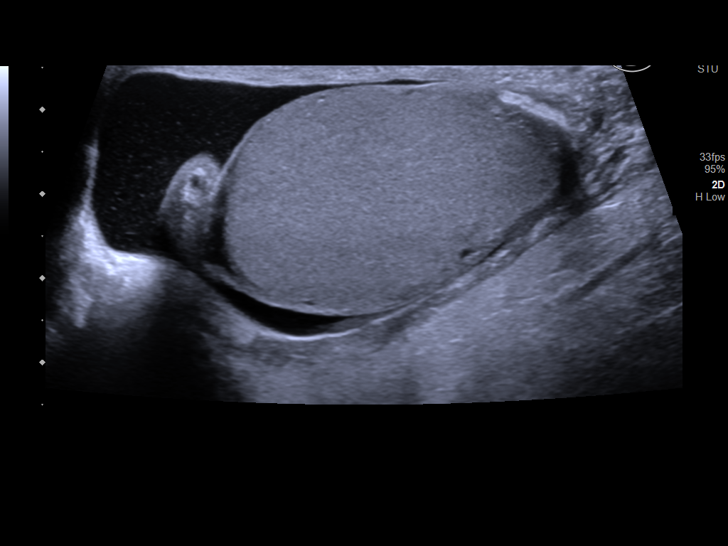
[im 18/105]
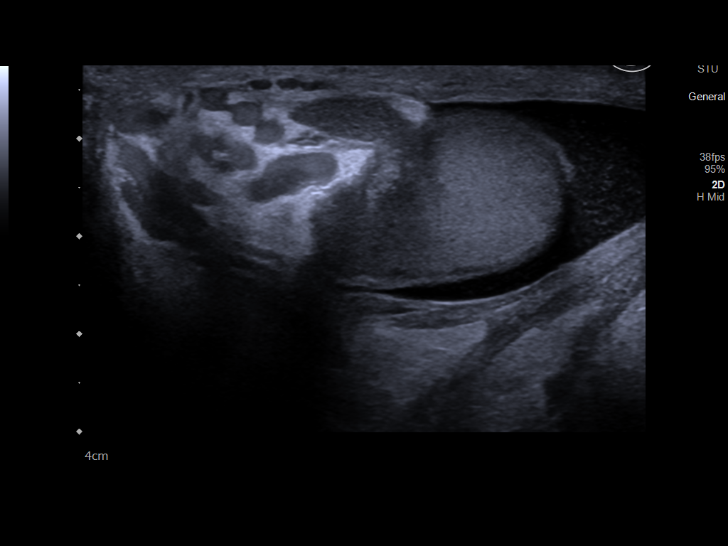
[im 27/105]
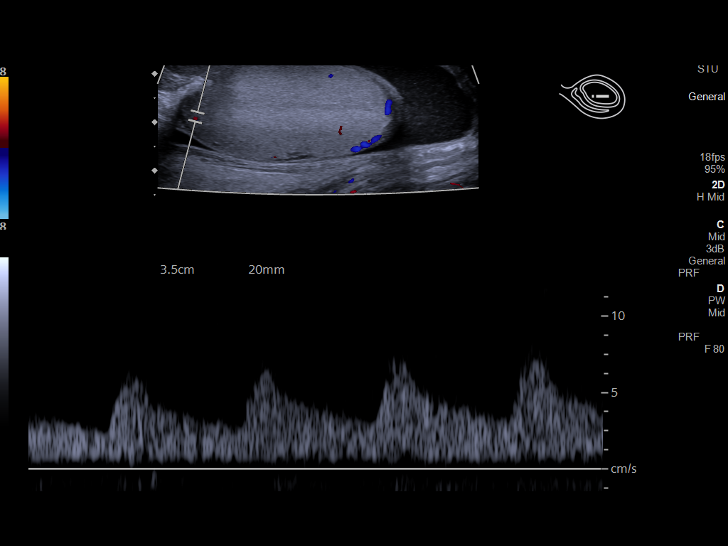
[im 35/105]
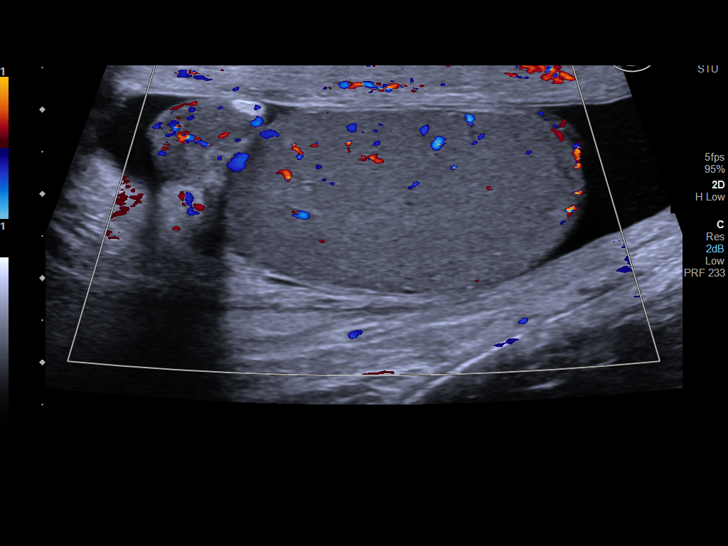
[im 44/105]
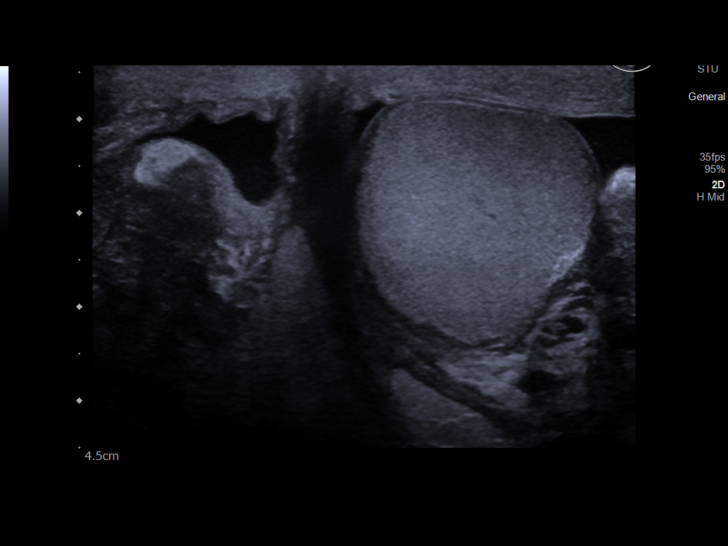
[im 53/105]
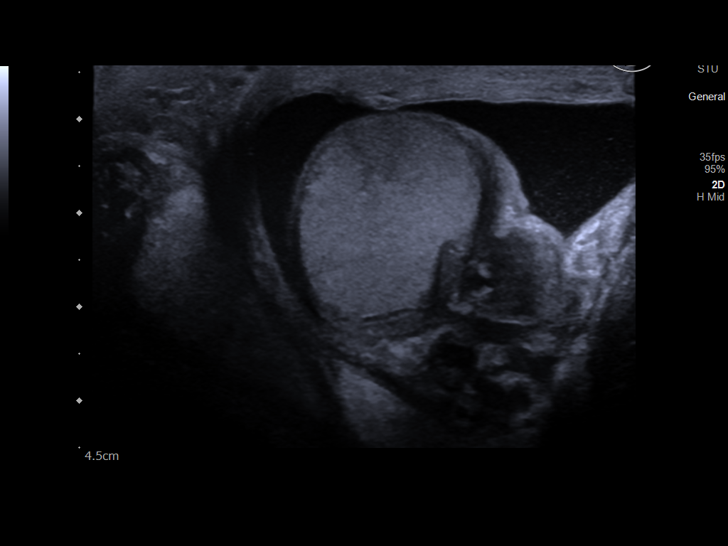
[im 61/105]
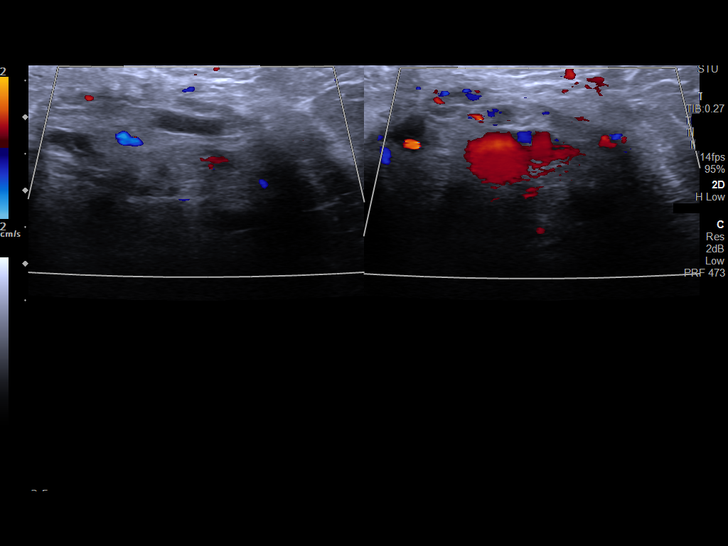
[im 70/105]
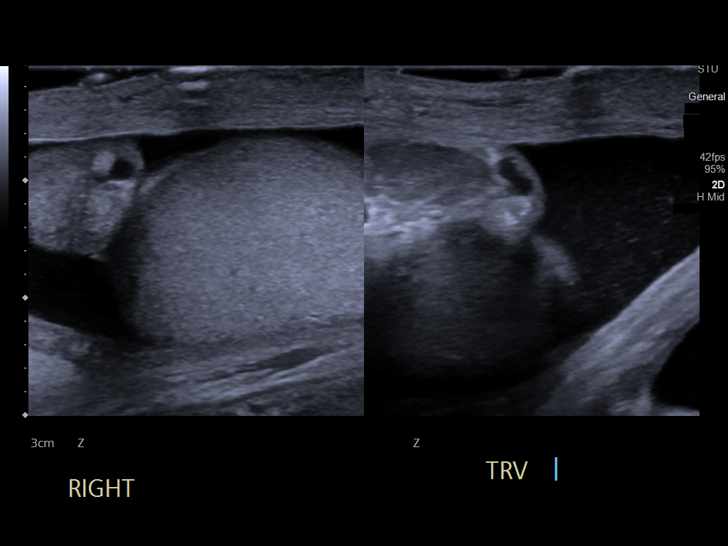
[im 79/105]
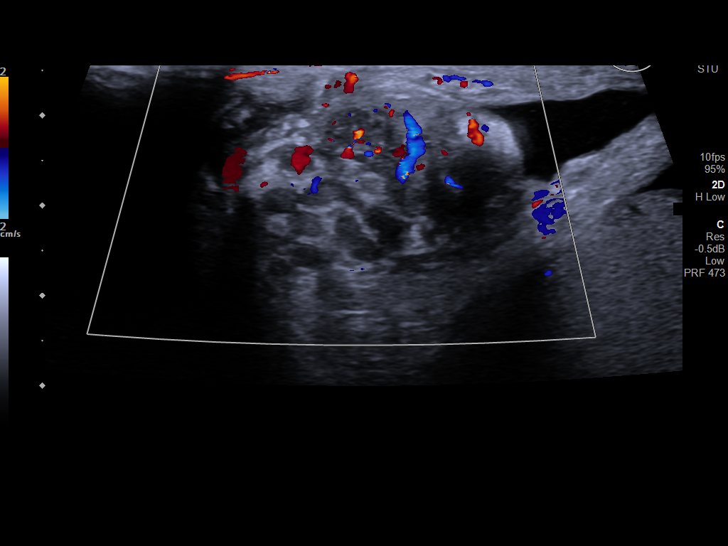
[im 87/105]
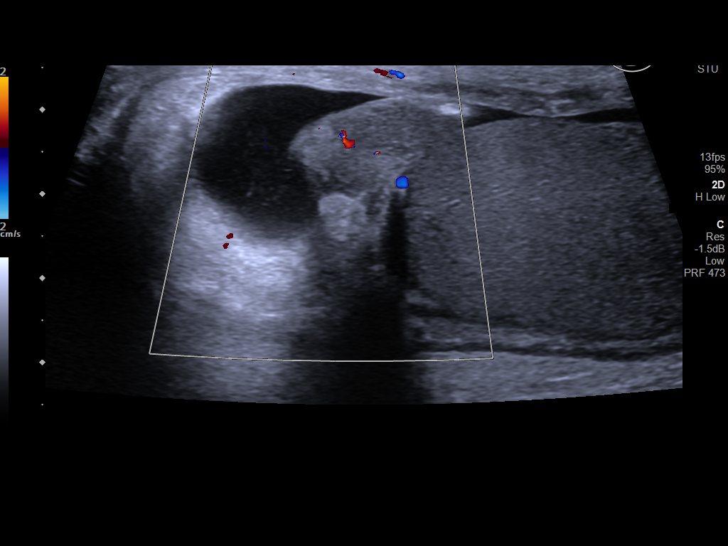
[im 96/105]
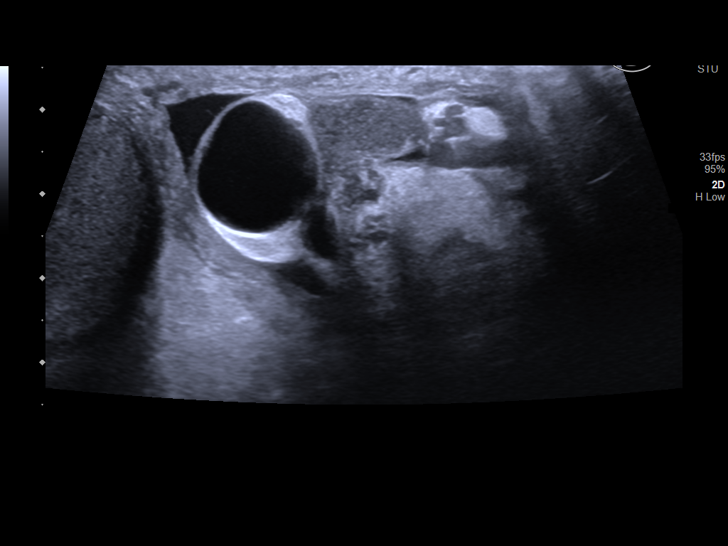
[im 105/105]
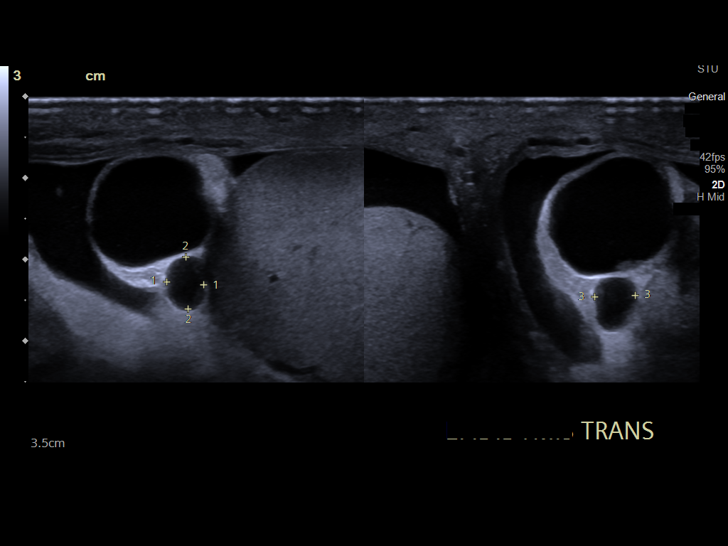

[13 of 25 positions shown; findings below may reference images not displayed]

FINDINGS: Right testicle

Measurements: 4.1 x 2.8 x 2.9 cm. Homogeneous echogenicity. Normal
blood flow. No mass or microlithiasis visualized.

Left testicle

Measurements: 4.4 x 2.3 x 2.6 cm. Homogeneous echogenicity. Normal
blood flow. No mass or microlithiasis visualized.

Right epididymis: Enlarged and heterogeneous with increased
echogenicity of the adjacent fat. The previous hyperechoic tubular
structure tracking through the epididymis is not definitively seen
on the current exam. There is mild epididymal hyperemia. Tiny
epididymal head cysts measuring 2 mm.

Left epididymis: Cysts in the epididymal head largest measuring
cm. Slight increased echogenicity of the adjacent parenchyma. The
epididymis is heterogeneous with mild increased vascularity.

Hydrocele:  Small bilateral, mildly complex.

Varicocele: Present bilaterally. The previous question thrombosed
vein on the right is not seen on the current exam.

Pulsed Doppler interrogation of both testes demonstrates normal low
resistance arterial and venous waveforms bilaterally.
IMPRESSION: 1. Enlarged and heterogeneous appearance of both epididymi with
increased vascularity, suggestive of epididymitis.
2. The hyperechoic tubular structure in the right epididymis on
prior exam is no longer seen.
3. Left greater than right epididymal head cysts again seen.
4. Complex bilateral hydroceles again seen.
5. Bilateral varicoceles.

## 2024-05-25 ENCOUNTER — Ambulatory Visit (INDEPENDENT_AMBULATORY_CARE_PROVIDER_SITE_OTHER): Admitting: Urology

## 2024-05-25 VITALS — BP 122/77 | HR 83 | Ht 72.0 in | Wt 285.0 lb

## 2024-05-25 DIAGNOSIS — R972 Elevated prostate specific antigen [PSA]: Secondary | ICD-10-CM

## 2024-05-26 ENCOUNTER — Encounter: Payer: Self-pay | Admitting: Urology

## 2024-05-26 ENCOUNTER — Other Ambulatory Visit: Payer: Self-pay

## 2024-05-26 DIAGNOSIS — R972 Elevated prostate specific antigen [PSA]: Secondary | ICD-10-CM

## 2024-05-26 NOTE — Progress Notes (Signed)
 05/25/2024 8:05 AM   Tyler Montgomery 26-Dec-1960 [id number]  Referring provider: [name], MD [address] [place],  [place] [address]  Chief Complaint  Patient presents with   Elevated PSA    HPI: Tyler Montgomery is a 63 y.o. male referred for evaluation of an elevated PSA.  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 On Xarelto    PMH: Past Medical History:  Diagnosis Date   A-fib (HCC)    Cardiomyopathy (HCC)    Dysrhythmia    Pre-diabetes     Surgical History: Past Surgical History:  Procedure Laterality Date   COLONOSCOPY WITH PROPOFOL  N/A 03/07/2024   Procedure: COLONOSCOPY WITH PROPOFOL ;  Surgeon: [name], MD;  Location: [facility] ENDOSCOPY;  Service: Endoscopy;  Laterality: N/A;   EP IMPLANTABLE DEVICE     KNEE ARTHROSCOPY Right    NASAL SINUS SURGERY     POLYPECTOMY  03/07/2024   Procedure: POLYPECTOMY, INTESTINE;  Surgeon: [name], MD;  Location: [facility] ENDOSCOPY;  Service: Endoscopy;;    Home Medications:  Allergies as of 05/25/2024       Reactions   Influenza Virus Vaccine Other (See Comments)   Hospitalized for 5 days after   Perflutren Lipid Microspheres Other (See Comments)   Other reaction(s): Muscle Pain Legs gave out   Tomato Hives   Other    Flu vaccine, pt was in the hospital 5 days after getting vaccine        Medication List        Accurate as of May 25, 2024 11:59 PM. If you have any questions, ask your nurse or doctor.          Acetaminophen  500 MG capsule Take by mouth.   [employer] D3 50 MCG (2000 UT) Caps Generic drug: Cholecalciferol Take 2,000 Units by mouth daily.   cyanocobalamin 1000 MCG tablet Take 1,000 mcg by mouth  daily.   cyclobenzaprine 5 MG tablet Commonly known as: FLEXERIL Take 5 mg by mouth 3 (three) times daily as needed for muscle spasms.   furosemide  40 MG tablet Commonly known as: LASIX  Take 2 tablets (80 mg total) by mouth daily. Take 2 tablets in the morning and 1 tablet in the evening.   gabapentin 300 MG capsule Commonly known as: NEURONTIN Take 300 mg by mouth 3 (three) times daily.   losartan 25 MG tablet Commonly known as: COZAAR Take 1 tablet by mouth daily.   metoprolol  succinate 25 MG 24 hr tablet Commonly known as: TOPROL -XL Take 1 tablet by mouth daily.   metoprolol  succinate 100 MG 24 hr tablet Commonly known as: TOPROL -XL Take 100 mg by mouth daily.   metoprolol  tartrate 25 MG tablet Commonly known as: LOPRESSOR  Take 25 mg by mouth daily.   omeprazole 40 MG capsule Commonly known as: PRILOSEC Take 1 capsule by mouth daily.   potassium chloride  10 MEQ tablet Commonly known as: KLOR-CON  M Take 10 mEq by mouth daily.   pravastatin 20 MG tablet Commonly known as: PRAVACHOL Take 20 mg by mouth daily.   sulfamethoxazole-trimethoprim 800-160 MG tablet Commonly known as: BACTRIM DS SMARTSIG:1 Tablet(s) By Mouth Every 12 Hours   traMADol 50 MG tablet Commonly known as: ULTRAM Take by mouth every 6 (  six) hours as needed.   Xarelto  20 MG Tabs tablet Generic drug: rivaroxaban  Take 1 tablet by mouth every evening.        Allergies:  Allergies  Allergen Reactions   Influenza Virus Vaccine Other (See Comments)    Hospitalized for 5 days after   Perflutren Lipid Microspheres Other (See Comments)    Other reaction(s): Muscle Pain Legs gave out    Tomato Hives   Other     Flu vaccine, pt was in the hospital 5 days after getting vaccine    Family History: Family History  Problem Relation Age of Onset   Diabetes Brother    Hypertension Brother     Social History:  reports that he has quit smoking. His smoking use included cigarettes. He has  never used smokeless tobacco. He reports that he does not drink alcohol and does not use drugs.   Physical Exam: BP 122/77   Pulse 83   Ht 6' (1.829 m)   Wt 285 lb (129.3 kg)   BMI 38.65 kg/m   Constitutional:  Alert, No acute distress. HEENT: [place] AT Respiratory: Normal respiratory effort, no increased work of breathing. GU: No CVA tenderness Psychiatric: Normal mood and affect.   Assessment & Plan:    1.  Elevated PSA Although PSA is a prostate cancer screening test he was informed that cancer is not the most common cause of an elevated PSA. Other potential causes including BPH and inflammation were discussed.  He was informed that the only way to adequately diagnose prostate cancer would be transrectal ultrasound and biopsy of the prostate. The procedure was discussed including potential risks of bleeding and infection/sepsis. He was also informed that a negative biopsy does not conclusively rule out the possibility that prostate cancer may be present and that continued monitoring is required.  The use of newer adjunctive blood and urine tests to predict the probability of high-grade prostate cancer were discussed. The use of multiparametric prostate MRI to evaluate for lesions suspicious for high grade prostate cancer and aid in targeted bx was reviewed.  Continued periodic surveillance was also discussed. 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  He would like to proceed with standard TRUS/biopsy and has requested it be performed under sedation.   [name], MD  [facility] [address], [address] [place], [place] [address] [phone]

## 2024-05-26 NOTE — Progress Notes (Signed)
 Surgical Physician Order Form [facility]  Dr. Glendia Barba, MD  * Scheduling expectation : Patient preference, sees an oral surgeon today regarding a broken tooth  *Length of Case: 30 minutes  *Clearance needed: yes  *Anticoagulation Instructions: Hold all anticoagulants  *Aspirin Instructions: N/A  *Post-op visit Date/Instructions: TBD  *Diagnosis: Elevated PSA  *Procedure:  Prostate Biopsy (55700) TRUS (23127) US  Hlpiz(23057)    Additional orders:   -Admit type:   -Anesthesia:   -VTE Prophylaxis Standing Order SCD's       Other:   -Standing Lab Orders Per Anesthesia    Lab other:   -Standing Test orders EKG/Chest x-ray per Anesthesia       Test other:   - Medications:    -Other orders:  Fleets enema AM

## 2024-05-26 NOTE — H&P (View-Only) (Signed)
 05/25/2024 8:05 AM   Tyler Montgomery 26-Dec-1960 [id number]  Referring provider: [name], MD [address] [place],  [place] [address]  Chief Complaint  Patient presents with   Elevated PSA    HPI: Tyler Montgomery is a 63 y.o. male referred for evaluation of an elevated PSA.  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 On Xarelto    PMH: Past Medical History:  Diagnosis Date   A-fib (HCC)    Cardiomyopathy (HCC)    Dysrhythmia    Pre-diabetes     Surgical History: Past Surgical History:  Procedure Laterality Date   COLONOSCOPY WITH PROPOFOL  N/A 03/07/2024   Procedure: COLONOSCOPY WITH PROPOFOL ;  Surgeon: [name], MD;  Location: [facility] ENDOSCOPY;  Service: Endoscopy;  Laterality: N/A;   EP IMPLANTABLE DEVICE     KNEE ARTHROSCOPY Right    NASAL SINUS SURGERY     POLYPECTOMY  03/07/2024   Procedure: POLYPECTOMY, INTESTINE;  Surgeon: [name], MD;  Location: [facility] ENDOSCOPY;  Service: Endoscopy;;    Home Medications:  Allergies as of 05/25/2024       Reactions   Influenza Virus Vaccine Other (See Comments)   Hospitalized for 5 days after   Perflutren Lipid Microspheres Other (See Comments)   Other reaction(s): Muscle Pain Legs gave out   Tomato Hives   Other    Flu vaccine, pt was in the hospital 5 days after getting vaccine        Medication List        Accurate as of May 25, 2024 11:59 PM. If you have any questions, ask your nurse or doctor.          Acetaminophen  500 MG capsule Take by mouth.   CVS D3 50 MCG (2000 UT) Caps Generic drug: Cholecalciferol Take 2,000 Units by mouth daily.   cyanocobalamin 1000 MCG tablet Take 1,000 mcg by mouth  daily.   cyclobenzaprine 5 MG tablet Commonly known as: FLEXERIL Take 5 mg by mouth 3 (three) times daily as needed for muscle spasms.   furosemide  40 MG tablet Commonly known as: LASIX  Take 2 tablets (80 mg total) by mouth daily. Take 2 tablets in the morning and 1 tablet in the evening.   gabapentin 300 MG capsule Commonly known as: NEURONTIN Take 300 mg by mouth 3 (three) times daily.   losartan 25 MG tablet Commonly known as: COZAAR Take 1 tablet by mouth daily.   metoprolol  succinate 25 MG 24 hr tablet Commonly known as: TOPROL -XL Take 1 tablet by mouth daily.   metoprolol  succinate 100 MG 24 hr tablet Commonly known as: TOPROL -XL Take 100 mg by mouth daily.   metoprolol  tartrate 25 MG tablet Commonly known as: LOPRESSOR  Take 25 mg by mouth daily.   omeprazole 40 MG capsule Commonly known as: PRILOSEC Take 1 capsule by mouth daily.   potassium chloride  10 MEQ tablet Commonly known as: KLOR-CON  M Take 10 mEq by mouth daily.   pravastatin 20 MG tablet Commonly known as: PRAVACHOL Take 20 mg by mouth daily.   sulfamethoxazole-trimethoprim 800-160 MG tablet Commonly known as: BACTRIM DS SMARTSIG:1 Tablet(s) By Mouth Every 12 Hours   traMADol 50 MG tablet Commonly known as: ULTRAM Take by mouth every 6 (  six) hours as needed.   Xarelto  20 MG Tabs tablet Generic drug: rivaroxaban  Take 1 tablet by mouth every evening.        Allergies:  Allergies  Allergen Reactions   Influenza Virus Vaccine Other (See Comments)    Hospitalized for 5 days after   Perflutren Lipid Microspheres Other (See Comments)    Other reaction(s): Muscle Pain Legs gave out    Tomato Hives   Other     Flu vaccine, pt was in the hospital 5 days after getting vaccine    Family History: Family History  Problem Relation Age of Onset   Diabetes Brother    Hypertension Brother     Social History:  reports that he has quit smoking. His smoking use included cigarettes. He has  never used smokeless tobacco. He reports that he does not drink alcohol and does not use drugs.   Physical Exam: BP 122/77   Pulse 83   Ht 6' (1.829 m)   Wt 285 lb (129.3 kg)   BMI 38.65 kg/m   Constitutional:  Alert, No acute distress. HEENT: [place] AT Respiratory: Normal respiratory effort, no increased work of breathing. GU: No CVA tenderness Psychiatric: Normal mood and affect.   Assessment & Plan:    1.  Elevated PSA Although PSA is a prostate cancer screening test he was informed that cancer is not the most common cause of an elevated PSA. Other potential causes including BPH and inflammation were discussed.  He was informed that the only way to adequately diagnose prostate cancer would be transrectal ultrasound and biopsy of the prostate. The procedure was discussed including potential risks of bleeding and infection/sepsis. He was also informed that a negative biopsy does not conclusively rule out the possibility that prostate cancer may be present and that continued monitoring is required.  The use of newer adjunctive blood and urine tests to predict the probability of high-grade prostate cancer were discussed. The use of multiparametric prostate MRI to evaluate for lesions suspicious for high grade prostate cancer and aid in targeted bx was reviewed.  Continued periodic surveillance was also discussed. 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  He would like to proceed with standard TRUS/biopsy and has requested it be performed under sedation.   [name], MD  [facility] [address], [address] [place], [place] [address] [phone]

## 2024-05-27 ENCOUNTER — Telehealth: Payer: Self-pay

## 2024-05-27 NOTE — Telephone Encounter (Signed)
 Per Dr. Twylla, Patient is to be scheduled for Transrectal Ultrasound Guided Prostate Biopsy   Mr. Makki was contacted and possible surgical dates were discussed, Tuesday July 29th, 2025 was agreed upon for surgery.   Patient was directed to call [phone] between 1-3pm the day before surgery to find out surgical arrival time.  Instructions were given not to eat or drink from midnight on the night before surgery and have a driver for the day of surgery. On the surgery day patient was instructed to enter through the [facility] entrance of [facility] report the Same Day Surgery desk.   Pre-Admit Testing will be in contact via phone to set up an interview with the anesthesia team to review your history and medications prior to surgery.   Reminder of this information was sent via MyChart to the patient.

## 2024-05-27 NOTE — Addendum Note (Signed)
 Addended by: RUTHER SETTER A on: 05/27/2024 03:37 PM   Modules accepted: Orders

## 2024-05-27 NOTE — Progress Notes (Signed)
  Phone Number: [phone] for Surgical Coordinator Fax Number: [phone]  REQUEST FOR SURGICAL CLEARANCE       Date: Date: 05/27/24  Faxed to: Dr. Ammon   Surgeon: Dr. Glendia Barba, MD     Date of Surgery: 06/14/2024  Operation: Transrectal Ultrasound Guided Prostate Biopsy   Anesthesia Type: MAC   Diagnosis: Elevated PSA  Patient Requires:   Cardiac / Vascular Clearance : Yes  Reason: Patient will need to hold Xarelto  prior to surgery, also needs Pacemaker Clearance form.    Risk Assessment:    Low   []       Moderate   []     High   []           This patient is optimized for surgery  YES []       NO   []    I recommend further assessment/workup prior to surgery. YES []      NO  []   Appointment scheduled for: _______________________   Further recommendations: ____________________________________     Physician Signature:__________________________________   Printed Name: ________________________________________   Date: _________________

## 2024-05-27 NOTE — Progress Notes (Signed)
   [facility] Surgical Posting Form  Surgery Date: Date: 06/14/2024  Surgeon: Dr. Glendia Barba, MD  Inpt ( No  )   Outpt (Yes)   Obs ( No  )   Diagnosis: R97.20 Elevated Prostate Specific Antigen  -CPT: 55700, [id number], [phone]  Surgery: Transrectal Ultrasound Guided Prostate Biopsy  Stop Anticoagulations: Yes, will need to hold Xarelto    Cardiac/Medical/Pulmonary Clearance needed: Yes and will need device form   Clearance needed from Dr: Ammon  Clearance request sent on: Date: 05/27/24   *Orders entered into EPIC  Date: 05/27/24   *Case booked in EPIC  Date: 05/26/2024  *Notified pt of Surgery: Date: 05/26/2024  PRE-OP UA & CX: No  *Placed into Prior Authorization Work [place] Date: 05/27/24  Assistant/laser/rep:Yes

## 2024-05-31 ENCOUNTER — Other Ambulatory Visit: Payer: Self-pay | Admitting: Urology

## 2024-05-31 DIAGNOSIS — R972 Elevated prostate specific antigen [PSA]: Secondary | ICD-10-CM

## 2024-06-01 ENCOUNTER — Telehealth: Payer: Self-pay

## 2024-06-01 NOTE — Telephone Encounter (Signed)
 Received Surgical Clearance for patient to hold Xarelto  prior to surgery. Patient is to hold Xarelto  for 3 days prior to procedure. He will start holding on Saturday July 26th, 2025. Information left on VM and will send information via MyChart as well.

## 2024-06-07 ENCOUNTER — Encounter
Admission: RE | Admit: 2024-06-07 | Discharge: 2024-06-07 | Disposition: A | Discharge status: Home / Self Care | Source: Ambulatory Visit | Attending: Urology | Admitting: Urology

## 2024-06-07 ENCOUNTER — Other Ambulatory Visit: Payer: Self-pay

## 2024-06-07 VITALS — Ht 72.0 in | Wt 285.0 lb

## 2024-06-07 DIAGNOSIS — I10 Essential (primary) hypertension: Secondary | ICD-10-CM | POA: Diagnosis not present

## 2024-06-07 DIAGNOSIS — E1140 Type 2 diabetes mellitus with diabetic neuropathy, unspecified: Secondary | ICD-10-CM

## 2024-06-07 DIAGNOSIS — Z01818 Encounter for other preprocedural examination: Secondary | ICD-10-CM | POA: Insufficient documentation

## 2024-06-07 DIAGNOSIS — Z01812 Encounter for preprocedural laboratory examination: Secondary | ICD-10-CM | POA: Diagnosis present

## 2024-06-07 DIAGNOSIS — Z01810 Encounter for preprocedural cardiovascular examination: Secondary | ICD-10-CM

## 2024-06-07 HISTORY — DX: Chronic kidney disease, stage 3 unspecified: N18.30

## 2024-06-07 HISTORY — DX: Other nonspecific abnormal finding of lung field: R91.8

## 2024-06-07 HISTORY — DX: Deficiency of other specified B group vitamins: E53.8

## 2024-06-07 HISTORY — DX: Type 2 diabetes mellitus with diabetic neuropathy, unspecified: E11.40

## 2024-06-07 HISTORY — DX: Other specified disorders of bone density and structure, multiple sites: M85.89

## 2024-06-07 HISTORY — DX: Pulmonary hypertension, unspecified: I27.20

## 2024-06-07 HISTORY — DX: Obstructive sleep apnea (adult) (pediatric): G47.33

## 2024-06-07 HISTORY — DX: Vitamin D deficiency, unspecified: E55.9

## 2024-06-07 HISTORY — DX: Nontoxic multinodular goiter: E04.2

## 2024-06-07 HISTORY — DX: Pure hypercholesterolemia, unspecified: E78.00

## 2024-06-07 HISTORY — DX: Morbid (severe) obesity due to excess calories: E66.01

## 2024-06-07 HISTORY — DX: Elevated prostate specific antigen (PSA): R97.20

## 2024-06-07 HISTORY — DX: Personal history of urinary calculi: Z87.442

## 2024-06-07 HISTORY — DX: Paroxysmal atrial fibrillation: I48.0

## 2024-06-07 HISTORY — DX: Bilateral primary osteoarthritis of knee: M17.0

## 2024-06-07 LAB — BASIC METABOLIC PANEL WITH GFR
Anion gap: 9 (ref 5–15)
BUN: 11 mg/dL (ref 8–23)
CO2: 23 mmol/L (ref 22–32)
Calcium: 8.8 mg/dL — ABNORMAL LOW (ref 8.9–10.3)
Chloride: 108 mmol/L (ref 98–111)
Creatinine, Ser: 0.91 mg/dL (ref 0.61–1.24)
GFR, Estimated: >60 mL/min (ref >60)
Glucose, Bld: 101 mg/dL — ABNORMAL HIGH (ref 70–99)
Potassium: 3.7 mmol/L (ref 3.5–5.1)
Sodium: 140 mmol/L (ref 135–145)

## 2024-06-07 NOTE — Patient Instructions (Addendum)
 Your procedure is scheduled on: Tuesday, July 29 Report to the Registration Desk on the 1st floor of the [facility] To find out your arrival time, please call [phone] between 1PM - 3PM on: Monday, July 28 If your arrival time is 6:00 am, do not arrive before that time as the [facility] entrance doors do not open until 6:00 am.  REMEMBER: Instructions that are not followed completely may result in serious medical risk, up to and including death; or upon the discretion of your surgeon and anesthesiologist your surgery may need to be rescheduled.  Do not eat or drink after midnight the night before surgery.  No gum chewing or hard candies.  One week prior to surgery: starting today, July 22 Stop Anti-inflammatories (NSAIDS) such as Advil, Aleve , Ibuprofen, Motrin, Naproxen , Naprosyn  and Aspirin based products such as Excedrin, Goody's Powder, BC Powder. Stop ANY OVER THE COUNTER supplements until after surgery.   You may however, continue to take Tylenol  if needed for pain up until the day of surgery.  Xarelto  - hold for 3 days before surgery. Last day to take is Friday, July 25. (Per instruction from Dr. Ammon). Resume AFTER surgery per surgeon instruction.  Continue taking all of your other prescription medications up until the day of surgery.  ON THE DAY OF SURGERY ONLY TAKE THESE MEDICATIONS WITH SIPS OF WATER:  Gabapentin Metoprolol  Potassium Pravastatin Albuterol  inhaler - bring with you to the hospital to use prior to surgery per anesthesia  Fleets enema as directed the morning of surgery before coming to the hospital  No Alcohol for 24 hours before or after surgery.  No Smoking including e-cigarettes for 24 hours before surgery.  No chewable tobacco products for at least 6 hours before surgery.  No nicotine patches on the day of surgery.  Do not use any recreational drugs for at least a week (preferably 2 weeks) before your surgery.  Please be advised that  the combination of cocaine and anesthesia may have negative outcomes, up to and including death. If you test positive for cocaine, your surgery will be cancelled.  On the morning of surgery brush your teeth with toothpaste and water, you may rinse your mouth with mouthwash if you wish. Do not swallow any toothpaste or mouthwash.  Do not wear jewelry, make-up, hairpins, clips or nail polish.  For welded (permanent) jewelry: bracelets, anklets, waist bands, etc.  Please have this removed prior to surgery.  If it is not removed, there is a chance that hospital personnel will need to cut it off on the day of surgery.  Do not wear lotions, powders, or perfumes.   Do not shave body hair from the neck down 48 hours before surgery.  Contact lenses, hearing aids and dentures may not be worn into surgery.  Do not bring valuables to the hospital. [facility] is not responsible for any missing/lost belongings or valuables.   Bring your C-PAP to the hospital in case you may have to spend the night.   Notify your doctor if there is any change in your medical condition (cold, fever, infection).  Wear comfortable clothing (specific to your surgery type) to the hospital.  After surgery, you can help prevent lung complications by doing breathing exercises.  Take deep breaths and cough every 1-2 hours.   If you are being discharged the day of surgery, you will not be allowed to drive home. You will need a responsible individual to drive you home and stay with you  for 24 hours after surgery.   If you are taking public transportation, you will need to have a responsible individual with you.  Please call the Pre-admissions Testing Dept. at [phone] if you have any questions about these instructions.  Surgery Visitation Policy:  Patients having surgery or a procedure may have two visitors.  Children under the age of 71 must have an adult with them who is not the patient.  Passenger transport manager to address health-related social needs:  [email or web address]

## 2024-06-10 ENCOUNTER — Encounter: Payer: Self-pay | Admitting: Urology

## 2024-06-10 NOTE — Progress Notes (Signed)
 Perioperative / Anesthesia Services  Pre-Admission Testing Clinical Review / Pre-Operative Anesthesia Consult  Date: 06/10/24  PATIENT DEMOGRAPHICS: Name: Tyler Montgomery DOB: Sep 07, 1961 MRN:   [id number]  Note: Available PAT nursing documentation and vital signs have been reviewed. Clinical nursing staff has updated patient's PMH/PSHx, current medication list, and drug allergies/intolerances to ensure complete and comprehensive history available to assist care teams in MDM as it pertains to the aforementioned surgical procedure and anticipated anesthetic course. Extensive review of available clinical information personally performed. [place] PMH and PSHx updated with any diagnoses/procedures that  may have been inadvertently omitted during his intake with the pre-admission testing department's nursing staff.  PLANNED SURGICAL PROCEDURE(S):   Case: [id number] Date/Time: 06/14/24 0715   Procedure: BIOPSY, PROSTATE, RECTAL APPROACH, WITH US  GUIDANCE   Anesthesia type: Monitor Anesthesia Care   Diagnosis: Elevated PSA [R97.20]   Pre-op diagnosis: Elevated Prostate Specific Antigen   Location: [facility] OR ROOM 10 / [facility] ORS FOR ANESTHESIA GROUP   Surgeons: Twylla Glendia BROCKS, MD        CLINICAL DISCUSSION: Tyler Montgomery is a 63 y.o. male who is submitted for pre-surgical anesthesia review and clearance prior to him undergoing the above procedure. Patient is a Former Games developer. Pertinent PMH includes: nonischemic dilated cardiomyopathy, HFrEF, NSVT (s/p AICD placement), PAF, pulmonary hypertension, TBI, HTN, HLD, T2DM, multiple thyroid nodules, CKD-III, OSAH (requires nocturnal PAP therapy), elevated PSA, nephrolithiasis, OA, thoracolumbar DDD, depression.  Patient is followed by cardiology Andrea, MD). He was last seen in the cardiology clinic on 04/06/2024; notes reviewed. At the time of his clinic visit, patient doing well overall from a cardiovascular perspective.  Patient reporting  chronic exertional dyspnea that was reportedly stable and at baseline.  Additionally, patient with intermittent episodes of palpitations and chronic peripheral edema, again both of which were reported to be stable.  Patient denied any chest pain, PND, orthopnea, weakness, fatigue, vertiginous symptoms, or presyncope/syncope. Patient with a past medical history significant for cardiovascular diagnoses. Documented physical exam was grossly benign, providing no evidence of acute exacerbation and/or decompensation of the patient's known cardiovascular conditions.  Patient underwent diagnostic LEFT heart catheterization here at [facility] with Dr. Vinie Jude on 04/28/2022.  Overall, coronary anatomy was normal with the exception of minor luminal irregularities in the LAD.  There was no evidence of obstructive coronary artery disease.  Patient with a long-term cardiac event monitor studies showing episodes of NSVT in the setting of known nonischemic dilated cardiomyopathy and resulting HFrEF.  Patient was referred to electrophysiology for further evaluation. Following electrophysiology consults, patient underwent placement of a single-chamber Medtronic Evera XT VR device on 03/21/2013.  Device is regularly interrogated by patient's primary electrophysiology team.  Most recent interrogation was on 03/29/2024 at which time device was noted to be functioning properly.  Patient with a history of atrial fibrillation.  He underwent PVI ablation on 07/09/2016.  Cardiac function serially monitored via echocardiogram.  Most recent TTE performed on 05/05/2022 revealed a low normal left ventricular systolic function with an EF of 50%. There was mild concentric LVH.  There were no regional wall motion abnormalities. Left ventricular diastolic Doppler parameters consistent with abnormal relaxation (G1DD). Right ventricular size and function normal. There was trivial mitral, tricuspid, and  pulmonary valve regurgitation.  All transvalvular gradients were noted to be normal providing no evidence of hemodynamically significant valvular stenosis. Aorta normal in size with no evidence of ectasia or aneurysmal dilatation.  Patient with an atrial fibrillation  diagnosis; CHA2DS2-VASc Score = 3 (HFrEF, HTN, T2DM).as previously mentioned, he underwent ablation on 07/09/2016.  His rate and rhythm are currently being maintained on oral metoprolol  succinate with additional pill in pocket metoprolol  tartrate for as needed use. He is chronically anticoagulated using rivaroxaban ; reported to be compliant with therapy with no evidence or reports of GI/GU bleeding.  Blood pressure well controlled at 118/72 mmHg on currently prescribed diuretic (furosemide ), ARB (losartan), and beta-blocker (metoprolol  succinate/tartrate) therapies. He is on pravastatin for his HLD diagnosis and further ASCVD prevention. T2DM well controlled on currently prescribed regimen; last HgbA1c was 6.5% when checked on 04/14/2024. Patient is able to complete all of his  ADL/IADLs without cardiovascular limitation.  Per the DASI, patient is able to achieve at least 4 METS of physical activity without experiencing any significant degree of angina/anginal equivalent symptoms. No changes were made to his medication regimen during his visit with cardiology.  Patient scheduled to follow-up with outpatient cardiology in 3 months or sooner if needed.  Tyler Montgomery is scheduled for an elective BIOPSY, PROSTATE, RECTAL APPROACH, WITH US  GUIDANCE on 06/15/2019, with Dr. Glendia Barba, MD.  Given patient's past medical history significant for cardiovascular diagnoses, presurgical cardiac clearance was sought by the PAT team. Per cardiology, this patient is optimized for surgery and may proceed with the planned procedural course with a LOW risk of significant perioperative cardiovascular complications.  Again, this patient is on daily oral  anticoagulation therapy using a DOAC medication.  He has been instructed on recommendations for holding his rivaroxaban  for 3 days prior to his procedure with plans to restart as soon as postoperative bleeding risk felt to be minimized by his primary attending surgeon. The patient has been instructed that his last dose of should be on 06/10/2024.  Patient denies previous perioperative complications with anesthesia in the past. In review his EMR, it is noted that patient underwent a general anesthetic course here at [facility] (ASA III) in 02/2024 without documented complications.   MOST RECENT VITAL SIGNS:    06/07/2024   11:44 AM 05/25/2024   12:53 PM 03/07/2024   10:27 AM  Vitals with BMI  Height 6' 0 6' 0   Weight 285 lbs 285 lbs   BMI 38.64 38.64   Systolic  122 119  Diastolic  77 81  Pulse  83    PROVIDERS/SPECIALISTS: NOTE: Primary physician provider listed below. Patient may have been seen by APP or partner within same practice.   PROVIDER ROLE / SPECIALTY LAST SHERLEAN Barba Glendia JAYSON, MD Urology (Surgeon) 05/25/2024  [name], MD Primary Care Provider 04/21/2024  Ammon Blunt, MD Cardiology 04/06/2024  Dorice Glendia, MD Pain Management 05/04/2024   ALLERGIES: Allergies  Allergen Reactions   Definity [Perflutren Lipid Microsphere] Other (See Comments)    Muscle pain/legs gave out   Influenza Virus Vaccine Other (See Comments)    Hospitalized for 5 days after   Tomato Hives    CURRENT HOME MEDICATIONS: No current facility-administered medications for this encounter.    acetaminophen  (TYLENOL ) 500 MG tablet   albuterol  (VENTOLIN  HFA) 108 (90 Base) MCG/ACT inhaler   CAPSAICIN ARTHRITIS RELIEF EX   Cholecalciferol ([employer] D3) 2000 units CAPS   cyanocobalamin (VITAMIN B12) 1000 MCG tablet   cyclobenzaprine (FLEXERIL) 5 MG tablet   fluticasone (FLONASE) 50 MCG/ACT nasal spray   furosemide  (LASIX ) 40 MG tablet   gabapentin  (NEURONTIN) 400 MG capsule   Glycerin (OPTASE COMFORT DRY EYE OP)  HYPOCHLOROUS ACID EX   losartan (COZAAR) 25 MG tablet   magnesium oxide (MAG-OX) 400 (240 Mg) MG tablet   metoprolol  succinate (TOPROL -XL) 100 MG 24 hr tablet   metoprolol  tartrate (LOPRESSOR ) 25 MG tablet   Misc. Devices (HEAT THERAPY) MISC   NON FORMULARY   potassium chloride  (KLOR-CON  M) 10 MEQ tablet   pravastatin (PRAVACHOL) 20 MG tablet   topiramate (TOPAMAX) 50 MG tablet   traMADol (ULTRAM) 50 MG tablet   Trypsin-Castor Oil-Peru Balsam (OPTASE EX)   XARELTO  20 MG TABS tablet   cycloSPORINE (RESTASIS) 0.05 % ophthalmic emulsion   HISTORY: Past Medical History:  Diagnosis Date   AICD (automatic cardioverter/defibrillator) present 03/21/2013   a.) single chamber Medtronic Evera XT VR   B12 deficiency    Chronic renal insufficiency, stage 3 (moderate) (HCC)    Controlled type 2 diabetes with neuropathy (HCC)    DDD (degenerative disc disease), thoracolumbar    Depression    Elevated PSA    HFrEF (heart failure with reduced ejection fraction) (HCC)    History of kidney stones    HTN (hypertension)    Hypercholesterolemia    Hypogonadism in male    Migraines    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 while in the military    Multiple lung nodules    Multiple thyroid nodules    Nonischemic dilated cardiomyopathy (HCC)    NSVT (nonsustained ventricular tachycardia) (HCC)    a.) s/p AICD placement 03/21/2013   On rivaroxaban  therapy    OSA on CPAP    Osteoarthritis    Osteopenia of multiple sites    Paroxysmal A-fib (HCC)    a.) CHA2DS2VASc = 3 (HFrEF, HTN, T2DM) as of 06/10/2024; b.) s/p PVI ablation 07/09/2016; c.) rate/rhythm maintained on oral metoprolol  succinante with additional metoprolol  tartrate PRN; chronically anticoagulated with rivaroxaban    Pulmonary hypertension (HCC)    Severe obesity (BMI 35.0-39.9) with comorbidity (HCC)    Severe obstructive sleep apnea    Tremor    Ulnar neuropathy at elbow, left     Vitamin D deficiency    Past Surgical History:  Procedure Laterality Date   CARDIAC ELECTROPHYSIOLOGY STUDY AND ABLATION N/A 07/09/2016   COLONOSCOPY WITH PROPOFOL  N/A 03/07/2024   Procedure: COLONOSCOPY WITH PROPOFOL ;  Surgeon: [name], MD;  Location: [facility] ENDOSCOPY;  Service: Endoscopy;  Laterality: N/A;   ICD IMPLANT  03/31/2013   KNEE ARTHROSCOPY W/ MENISCAL REPAIR Right 02/29/2004   KNEE ARTHROSCOPY W/ MENISCAL REPAIR Right 04/01/2011   LEFT HEART CATH AND CORONARY ANGIOGRAPHY Left 04/28/2012   Procedure: LEFT HEART CATH AND CORONARY ANGIOGRAPHY; Location: [facility]; Surgeon: Vinie Jude, MD   NASAL SINUS SURGERY N/A 2003   POLYPECTOMY  03/07/2024   Procedure: POLYPECTOMY, INTESTINE;  Surgeon: [name], MD;  Location: [facility] ENDOSCOPY;  Service: Endoscopy;;   Family History  Problem Relation Age of Onset   Diabetes Brother    Hypertension Brother    Social History   Tobacco Use   Smoking status: Former    Types: Cigarettes   Smokeless tobacco: Former    Types: Chew  Substance Use Topics   Alcohol use: No   LABS:  Component Ref Range & Units 04/14/2024  WBC (White Blood Cell Count) 4.1 - 10.2 10^3/uL 4.7  RBC (Red Blood Cell Count) 4.69 - 6.13 10^6/uL 5.00  Hemoglobin 14.1 - 18.1 gm/dL 85.2  Hematocrit 59.9 - 52.0 % 44.7  MCV (Mean Corpuscular Volume) 80.0 - 100.0 fl 89.4  MCH (Mean Corpuscular Hemoglobin) 27.0 -  31.2 pg 29.4  MCHC (Mean Corpuscular Hemoglobin Concentration) 32.0 - 36.0 gm/dL 67.0  Platelet Count 849 - 450 10^3/uL 202  RDW-CV (Red Cell Distribution Width) 11.6 - 14.8 % 13.5  MPV (Mean Platelet Volume) 9.4 - 12.4 fl 9.9  Neutrophils 1.50 - 7.80 10^3/uL 2.58  Lymphocytes 1.00 - 3.60 10^3/uL 1.49  Monocytes 0.00 - 1.50 10^3/uL 0.36  Eosinophils 0.00 - 0.55 10^3/uL 0.16  Basophils 0.00 - 0.09 10^3/uL 0.05  Neutrophil % 32.0 - 70.0 % 55.6  Lymphocyte % 10.0 - 50.0 % 32.0  Monocyte % 4.0 - 13.0 % 7.7  Eosinophil  % 1.0 - 5.0 % 3.4  Basophil% 0.0 - 2.0 % 1.1  Immature Granulocyte % <=0.7 % 0.2  Immature Granulocyte Count <=0.06 10^3/L 0.01   Component Ref Range & Units 04/14/2024  Hemoglobin A1C 4.2 - 5.6 % 6.5 High   Average Blood Glucose (Calc) mg/dL 859   Hospital Outpatient Visit on 06/07/2024  Component Date Value Ref Range Status   Sodium 06/07/2024 140  135 - 145 mmol/L Final   Potassium 06/07/2024 3.7  3.5 - 5.1 mmol/L Final   Chloride 06/07/2024 108  98 - 111 mmol/L Final   CO2 06/07/2024 23  22 - 32 mmol/L Final   Glucose, Bld 06/07/2024 101 (H)  70 - 99 mg/dL Final   Glucose reference range applies only to samples taken after fasting for at least 8 hours.   BUN 06/07/2024 11  8 - 23 mg/dL Final   Creatinine, Ser 06/07/2024 0.91  0.61 - 1.24 mg/dL Final   Calcium 92/77/7974 8.8 (L)  8.9 - 10.3 mg/dL Final   GFR, Estimated 06/07/2024 >60  >60 mL/min Final   Comment: (NOTE) Calculated using the CKD-EPI Creatinine Equation (2021)    Anion gap 06/07/2024 9  5 - 15 Final   Performed at [facility], [address] [place], [place] [address]    ECG: Date: 06/07/2024  Time ECG obtained: 1358 PM Rate: 59 bpm Rhythm: Sinus bradycardia with first-degree AV block Axis (leads I and aVF): normal Intervals: PR 216 ms. QRS 86 ms. QTc 401 ms. ST segment and T wave changes: No evidence of acute T wave abnormalities or significant ST segment elevation or depression.  Evidence of a possible, age undetermined, prior infarct:  No Comparison: Previous tracing obtained on 12/02/2023 at [facility] revealed sinus rhythm with occasional atrial and ventricular ectopy at 80 bpm.   IMAGING / PROCEDURES: TRANSTHORACIC ECHOCARDIOGRAM performed on 05/05/2022 Low normal left ventricular systolic function with an EF of 50% Mild concentric LVH No regional wall motion abnormalities Left ventricular diastolic Doppler parameters consistent with abnormal relaxation (G1DD). Right  ventricular size and function normal Trivial MR, TR, and PR Normal gradients; no valvular stenosis   IMPRESSION AND PLAN: Tyler Montgomery has been referred for pre-anesthesia review and clearance prior to him undergoing the planned anesthetic and procedural courses. Available labs, pertinent testing, and imaging results were personally reviewed by me in preparation for upcoming operative/procedural course. [facility] medical record has been updated following extensive record review and patient interview with PAT staff.   This patient has been appropriately cleared by cardiology with an overall LOW risk of patient experiencing significant perioperative cardiovascular complications. Completed perioperative prescription for cardiac device management documentation completed by primary cardiology team and placed on patient's chart for review by the surgical/anesthetic team on the day of his procedure. Electrophysiology indicating that procedure should not interfere with the planned surgical procedure. Beyond normal  perioperative cardiovascular monitoring, there are no recommendations from his electrophysiology team that would prompt further discussions/recommendations from an industry representative.   Based on clinical review performed today (06/10/24), barring any significant acute changes in the patient's overall condition, it is anticipated that he will be able to proceed with the planned surgical intervention. Any acute changes in clinical condition may necessitate his procedure being postponed and/or cancelled. Patient will meet with anesthesia team (MD and/or CRNA) on the day of his procedure for preoperative evaluation/assessment. Questions regarding anesthetic course will be fielded at that time.   Pre-surgical instructions were reviewed with the patient during his PAT appointment, and questions were fielded to satisfaction by PAT clinical staff. He has been instructed on which medications that he  will need to hold prior to surgery, as well as the ones that have been deemed safe/appropriate to take on the day of his procedure. As part of the general education provided by PAT, patient made aware both verbally and in writing, that he would need to abstain from the use of any illegal substances during his perioperative course. He was advised that failure to follow the provided instructions could necessitate case cancellation or result in serious perioperative complications up to and including death. Patient encouraged to contact PAT and/or his surgeon's office to discuss any questions or concerns that may arise prior to surgery; verbalized understanding.   Dorise Pereyra, MSN, APRN, FNP-C, CEN [facility]  Perioperative Services Nurse Practitioner Phone: [phone] Fax: [phone] 06/10/24 9:42 AM  NOTE: This note has been prepared using Dragon dictation software. Despite my best ability to proofread, there is always the potential that unintentional transcriptional errors may still occur from this process.

## 2024-06-14 ENCOUNTER — Encounter: Payer: Self-pay | Admitting: Urology

## 2024-06-14 ENCOUNTER — Ambulatory Visit
Admission: RE | Admit: 2024-06-14 | Discharge: 2024-06-14 | Disposition: A | Discharge status: Home / Self Care | Attending: Urology | Admitting: Urology

## 2024-06-14 ENCOUNTER — Other Ambulatory Visit: Payer: Self-pay

## 2024-06-14 ENCOUNTER — Encounter
Admission: RE | Disposition: A | Discharge status: Home / Self Care | Payer: Self-pay | Source: Home / Self Care | Attending: Urology

## 2024-06-14 ENCOUNTER — Ambulatory Visit: Payer: Self-pay | Admitting: Urgent Care

## 2024-06-14 ENCOUNTER — Ambulatory Visit
Admission: RE | Admit: 2024-06-14 | Discharge: 2024-06-14 | Disposition: A | Discharge status: Home / Self Care | Source: Ambulatory Visit | Attending: Urology | Admitting: Urology

## 2024-06-14 DIAGNOSIS — I420 Dilated cardiomyopathy: Secondary | ICD-10-CM | POA: Insufficient documentation

## 2024-06-14 DIAGNOSIS — Z95810 Presence of automatic (implantable) cardiac defibrillator: Secondary | ICD-10-CM | POA: Insufficient documentation

## 2024-06-14 DIAGNOSIS — Z01812 Encounter for preprocedural laboratory examination: Secondary | ICD-10-CM

## 2024-06-14 DIAGNOSIS — E7800 Pure hypercholesterolemia, unspecified: Secondary | ICD-10-CM | POA: Insufficient documentation

## 2024-06-14 DIAGNOSIS — Z6838 Body mass index (BMI) 38.0-38.9, adult: Secondary | ICD-10-CM | POA: Diagnosis not present

## 2024-06-14 DIAGNOSIS — I5022 Chronic systolic (congestive) heart failure: Secondary | ICD-10-CM | POA: Diagnosis not present

## 2024-06-14 DIAGNOSIS — I480 Paroxysmal atrial fibrillation: Secondary | ICD-10-CM | POA: Insufficient documentation

## 2024-06-14 DIAGNOSIS — Z79899 Other long term (current) drug therapy: Secondary | ICD-10-CM | POA: Diagnosis not present

## 2024-06-14 DIAGNOSIS — E1140 Type 2 diabetes mellitus with diabetic neuropathy, unspecified: Secondary | ICD-10-CM | POA: Diagnosis not present

## 2024-06-14 DIAGNOSIS — G4733 Obstructive sleep apnea (adult) (pediatric): Secondary | ICD-10-CM | POA: Insufficient documentation

## 2024-06-14 DIAGNOSIS — I130 Hypertensive heart and chronic kidney disease with heart failure and stage 1 through stage 4 chronic kidney disease, or unspecified chronic kidney disease: Secondary | ICD-10-CM | POA: Diagnosis not present

## 2024-06-14 DIAGNOSIS — G4730 Sleep apnea, unspecified: Secondary | ICD-10-CM | POA: Diagnosis not present

## 2024-06-14 DIAGNOSIS — I272 Pulmonary hypertension, unspecified: Secondary | ICD-10-CM | POA: Diagnosis not present

## 2024-06-14 DIAGNOSIS — I472 Ventricular tachycardia, unspecified: Secondary | ICD-10-CM | POA: Insufficient documentation

## 2024-06-14 DIAGNOSIS — E1122 Type 2 diabetes mellitus with diabetic chronic kidney disease: Secondary | ICD-10-CM | POA: Insufficient documentation

## 2024-06-14 DIAGNOSIS — N183 Chronic kidney disease, stage 3 unspecified: Secondary | ICD-10-CM | POA: Insufficient documentation

## 2024-06-14 DIAGNOSIS — M1990 Unspecified osteoarthritis, unspecified site: Secondary | ICD-10-CM | POA: Insufficient documentation

## 2024-06-14 DIAGNOSIS — Z7901 Long term (current) use of anticoagulants: Secondary | ICD-10-CM | POA: Insufficient documentation

## 2024-06-14 DIAGNOSIS — Z87891 Personal history of nicotine dependence: Secondary | ICD-10-CM | POA: Diagnosis not present

## 2024-06-14 DIAGNOSIS — R972 Elevated prostate specific antigen [PSA]: Secondary | ICD-10-CM | POA: Insufficient documentation

## 2024-06-14 HISTORY — DX: Essential (primary) hypertension: I10

## 2024-06-14 HISTORY — DX: Long term (current) use of anticoagulants: Z79.01

## 2024-06-14 HISTORY — DX: Unspecified systolic (congestive) heart failure: I50.20

## 2024-06-14 HISTORY — DX: Migraine, unspecified, not intractable, without status migrainosus: G43.909

## 2024-06-14 HISTORY — DX: Dilated cardiomyopathy: I42.0

## 2024-06-14 HISTORY — DX: Tremor, unspecified: R25.1

## 2024-06-14 HISTORY — DX: Other intervertebral disc degeneration, thoracolumbar region: M51.35

## 2024-06-14 HISTORY — DX: Unspecified osteoarthritis, unspecified site: M19.90

## 2024-06-14 HISTORY — PX: PROSTATE BIOPSY: SHX241

## 2024-06-14 HISTORY — DX: Lesion of ulnar nerve, left upper limb: G56.22

## 2024-06-14 HISTORY — DX: Other ventricular tachycardia: I47.29

## 2024-06-14 HISTORY — DX: Testicular hypofunction: E29.1

## 2024-06-14 HISTORY — DX: Depression, unspecified: F32.A

## 2024-06-14 HISTORY — DX: Unspecified intracranial injury with loss of consciousness status unknown, initial encounter: S06.9XAA

## 2024-06-14 LAB — GLUCOSE, CAPILLARY
Glucose-Capillary: 128 mg/dL — ABNORMAL HIGH (ref 70–99)
Glucose-Capillary: 124 mg/dL — ABNORMAL HIGH (ref 70–99)

## 2024-06-14 SURGERY — BIOPSY, PROSTATE, RECTAL APPROACH, WITH US GUIDANCE
Anesthesia: General

## 2024-06-14 MED ORDER — CHLORHEXIDINE GLUCONATE 0.12 % MT SOLN
15.0000 mL | Freq: Once | OROMUCOSAL | Status: AC
  Administered 2024-06-14: 15 mL via OROMUCOSAL

## 2024-06-14 MED ORDER — PROPOFOL 500 MG/50ML IV EMUL
INTRAVENOUS | Status: DC | PRN
Start: 2024-06-14 — End: 2024-06-14
  Administered 2024-06-14: 150 ug/kg/min via INTRAVENOUS
  Administered 2024-06-14: 50 mg via INTRAVENOUS

## 2024-06-14 MED ORDER — SODIUM CHLORIDE 0.9 % IV SOLN: INTRAVENOUS | Status: DC

## 2024-06-14 MED ORDER — CHLORHEXIDINE GLUCONATE 0.12 % MT SOLN
OROMUCOSAL | Status: AC
  Filled 2024-06-14: qty 15

## 2024-06-14 MED ORDER — MIDAZOLAM HCL 2 MG/2ML IJ SOLN
INTRAMUSCULAR | Status: AC
  Filled 2024-06-14: qty 2

## 2024-06-14 MED ORDER — FENTANYL CITRATE (PF) 100 MCG/2ML IJ SOLN: 25.0000 ug | INTRAMUSCULAR | Status: DC | PRN

## 2024-06-14 MED ORDER — FENTANYL CITRATE (PF) 100 MCG/2ML IJ SOLN
INTRAMUSCULAR | Status: DC | PRN
  Administered 2024-06-14 (×2): 25 ug via INTRAVENOUS

## 2024-06-14 MED ORDER — ORAL CARE MOUTH RINSE: 15.0000 mL | Freq: Once | OROMUCOSAL | Status: AC

## 2024-06-14 MED ORDER — ONDANSETRON HCL 4 MG/2ML IJ SOLN
INTRAMUSCULAR | Status: DC | PRN
Start: 2024-06-14 — End: 2024-06-14
  Administered 2024-06-14: 4 mg via INTRAVENOUS

## 2024-06-14 MED ORDER — FENTANYL CITRATE (PF) 100 MCG/2ML IJ SOLN
INTRAMUSCULAR | Status: AC
Start: 2024-06-14 — End: 2024-06-14
  Filled 2024-06-14: qty 2

## 2024-06-14 MED ORDER — FLEET ENEMA RE ENEM
1.0000 | ENEMA | Freq: Once | RECTAL | Status: AC
  Administered 2024-06-14: 1 via RECTAL

## 2024-06-14 MED ORDER — SODIUM CHLORIDE 0.9 % IV SOLN
1.0000 g | Freq: Once | INTRAVENOUS | Status: AC
  Administered 2024-06-14: 1 g via INTRAVENOUS
  Filled 2024-06-14: qty 1

## 2024-06-14 MED ORDER — OXYCODONE HCL 5 MG/5ML PO SOLN: 5.0000 mg | Freq: Once | ORAL | Status: DC | PRN

## 2024-06-14 MED ORDER — LIDOCAINE HCL (CARDIAC) PF 100 MG/5ML IV SOSY
PREFILLED_SYRINGE | INTRAVENOUS | Status: DC | PRN
  Administered 2024-06-14: 60 mg via INTRAVENOUS

## 2024-06-14 MED ORDER — ACETAMINOPHEN 10 MG/ML IV SOLN: 1000.0000 mg | Freq: Once | INTRAVENOUS | Status: DC | PRN

## 2024-06-14 MED ORDER — PHENYLEPHRINE 80 MCG/ML (10ML) SYRINGE FOR IV PUSH (FOR BLOOD PRESSURE SUPPORT)
PREFILLED_SYRINGE | INTRAVENOUS | Status: DC | PRN
  Administered 2024-06-14 (×5): 80 ug via INTRAVENOUS

## 2024-06-14 MED ORDER — MIDAZOLAM HCL 2 MG/2ML IJ SOLN
INTRAMUSCULAR | Status: DC | PRN
  Administered 2024-06-14: 2 mg via INTRAVENOUS

## 2024-06-14 MED ORDER — DROPERIDOL 2.5 MG/ML IJ SOLN: 0.6250 mg | Freq: Once | INTRAMUSCULAR | Status: DC | PRN

## 2024-06-14 MED ORDER — PROPOFOL 1000 MG/100ML IV EMUL
INTRAVENOUS | Status: AC
  Filled 2024-06-14: qty 100

## 2024-06-14 MED ORDER — OXYCODONE HCL 5 MG PO TABS: 5.0000 mg | ORAL_TABLET | Freq: Once | ORAL | Status: DC | PRN

## 2024-06-14 MED ORDER — PROPOFOL 10 MG/ML IV BOLUS
INTRAVENOUS | Status: AC
  Filled 2024-06-14: qty 20

## 2024-06-14 SURGICAL SUPPLY — 4 items
COVER MAYO STAND STRL (DRAPES) ×1 IMPLANT
GLOVE BIOGEL PI IND STRL 7.5 (GLOVE) ×1 IMPLANT
INST BIOPSY MAXCORE 18GX25 (NEEDLE) ×1 IMPLANT
SURGILUBE 2OZ TUBE FLIPTOP (MISCELLANEOUS) ×1 IMPLANT
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
## 2024-06-14 NOTE — Transfer of Care (Signed)
 Immediate Anesthesia Transfer of Care Note  Patient: Tyler Montgomery  Procedure(s) Performed: BIOPSY, PROSTATE, RECTAL APPROACH, WITH US  GUIDANCE  Patient Location: PACU  Anesthesia Type:General  Level of Consciousness: drowsy  Airway & Oxygen Therapy: Patient Spontanous Breathing and Patient connected to face mask oxygen  Post-op Assessment: Report given to RN and Post -op Vital signs reviewed and stable  Post vital signs: Reviewed and stable  Last Vitals:  Vitals Value Taken Time  BP    Temp    Pulse 60   Resp 14   SpO2 98     Last Pain:  Vitals:   06/14/24 0633  TempSrc: Temporal  PainSc: 7          Complications: No notable events documented.

## 2024-06-14 NOTE — Op Note (Signed)
   Preoperative diagnosis:  Elevated PSA  Postoperative diagnosis:  Elevated PSA  Procedure: Transrectal ultrasound prostate Transrectal prostate biopsies  Surgeon: [name], MD  Anesthesia: MAC  Complications: None  Intraoperative findings:  Prostate dimensions: 4.9 cm x 6.1 cm x 6.1 cm Prostate volume: 96.9 cc  EBL: Minimal  Specimens: Standard 12 core biopsies  Indication: Tyler Montgomery is a 63 y.o. patient with an elevated PSA of 6.48.  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  After reviewing the management options for treatment, he elected to proceed with the above surgical procedure(s). We have discussed the potential benefits and risks of the procedure, side effects of the proposed treatment, the likelihood of the patient achieving the goals of the procedure, and any potential problems that might occur during the procedure or recuperation. Informed consent has been obtained.  Description of procedure:  The patient was taken to the operating room and was not transferred to the OR table.  He remained on the OR stretcher and was placed in the left lateral decubitus position.  IV sedation was obtained by anesthesia.  DRE was performed and estimated volume 60+ cc.  No nodules or induration appreciated.  A transrectal ultrasound probe with needle guide was lubricated and gently inserted per rectum.  Prostate ultrasound was performed and transverse and sagittal planes with findings as described above.  Standard 12 core biopsies were then performed under ultrasound guidance.  No significant bleeding was noted.  He was then transferred to the PACU in stable condition.  Plan: He will be contacted with the biopsy results   [name], M.D.

## 2024-06-14 NOTE — Interval H&P Note (Signed)
 History and Physical Interval Note:  06/14/2024 7:15 AM  Tyler Montgomery  has presented today for surgery, with the diagnosis of Elevated Prostate Specific Antigen.  The various methods of treatment have been discussed with the patient and family. After consideration of risks, benefits and other options for treatment, the patient has consented to  Procedure(s): BIOPSY, PROSTATE, RECTAL APPROACH, WITH US  GUIDANCE (N/A) as a surgical intervention.  The patient's history has been reviewed, patient examined, no change in status, stable for surgery.  I have reviewed the patient's chart and labs.  Questions were answered to the patient's satisfaction.    CV:RRR Lungs:clear  [name]

## 2024-06-14 NOTE — Anesthesia Postprocedure Evaluation (Signed)
 Anesthesia Post Note  Patient: Tyler Montgomery  Procedure(s) Performed: BIOPSY, PROSTATE, RECTAL APPROACH, WITH US  GUIDANCE  Patient location during evaluation: PACU Anesthesia Type: General Level of consciousness: awake and alert Pain management: pain level controlled Vital Signs Assessment: post-procedure vital signs reviewed and stable Respiratory status: spontaneous breathing, nonlabored ventilation and respiratory function stable Cardiovascular status: blood pressure returned to baseline and stable Postop Assessment: no apparent nausea or vomiting Anesthetic complications: no   No notable events documented.   Last Vitals:  Vitals:   06/14/24 0907 06/14/24 0916  BP: 119/78 112/79  Pulse: (!) 58   Resp: 18   Temp: 36.5 C   SpO2: 100%     Last Pain:  Vitals:   06/14/24 0907  TempSrc: Temporal  PainSc: 0-No pain                 Camellia Merilee Louder

## 2024-06-14 NOTE — Discharge Instructions (Signed)
 Prostate Biopsy Discharge Instructions  No strenuous activity x 48 hours Blood from the rectum is normal and typically resolves within 12 hours Blood in the urine is normal and can persist intermittently for 2-3 weeks Blood in the semen is normal and can take 6-8 weeks to clear Contact our office at [phone] for excessive bleeding from the rectum or urine; inability to urinate or fever >101 degrees You may resume Xarelto  06/14/2024 if no significant blood in the urine or rectal bleeding

## 2024-06-15 ENCOUNTER — Encounter: Payer: Self-pay | Admitting: Urology

## 2024-06-15 LAB — SURGICAL PATHOLOGY

## 2024-06-19 ENCOUNTER — Ambulatory Visit: Payer: Self-pay | Admitting: Urology

## 2024-06-20 NOTE — Telephone Encounter (Signed)
 Lvm for patient to call office to schedule 6 month follow up appt with Dr. Twylla; with PSA lab prior

## 2024-08-29 ENCOUNTER — Encounter: Payer: Self-pay | Admitting: Urology

## 2024-12-05 ENCOUNTER — Other Ambulatory Visit: Payer: Self-pay

## 2024-12-05 DIAGNOSIS — R972 Elevated prostate specific antigen [PSA]: Secondary | ICD-10-CM

## 2024-12-19 ENCOUNTER — Other Ambulatory Visit

## 2024-12-21 ENCOUNTER — Other Ambulatory Visit

## 2024-12-21 ENCOUNTER — Ambulatory Visit: Admitting: Urology

## 2024-12-21 DIAGNOSIS — R972 Elevated prostate specific antigen [PSA]: Secondary | ICD-10-CM

## 2024-12-22 LAB — PSA: Prostate Specific Ag, Serum: 6.4 ng/mL — ABNORMAL HIGH (ref 0.0–4.0)

## 2024-12-27 ENCOUNTER — Ambulatory Visit: Admitting: Urology
